# Patient Record
Sex: Female | Born: 1990 | Race: White | Hispanic: No | Marital: Single | Smoking: Never smoker
Health system: Southern US, Community
[De-identification: ages and names within clinical notes are randomized; demographics above are authoritative.]

## PROBLEM LIST (undated history)

## (undated) ENCOUNTER — Inpatient Hospital Stay (HOSPITAL_COMMUNITY): Payer: Self-pay

## (undated) ENCOUNTER — Telehealth

## (undated) ENCOUNTER — Encounter

## (undated) ENCOUNTER — Inpatient Hospital Stay

## (undated) DIAGNOSIS — Q5122 Other partial doubling of uterus: Secondary | ICD-10-CM

## (undated) DIAGNOSIS — R51 Headache: Secondary | ICD-10-CM

## (undated) DIAGNOSIS — Z8619 Personal history of other infectious and parasitic diseases: Secondary | ICD-10-CM

## (undated) DIAGNOSIS — N390 Urinary tract infection, site not specified: Secondary | ICD-10-CM

## (undated) DIAGNOSIS — O26613 Liver and biliary tract disorders in pregnancy, third trimester: Secondary | ICD-10-CM

## (undated) DIAGNOSIS — R519 Headache, unspecified: Secondary | ICD-10-CM

## (undated) DIAGNOSIS — K831 Obstruction of bile duct: Secondary | ICD-10-CM

## (undated) DIAGNOSIS — O039 Complete or unspecified spontaneous abortion without complication: Secondary | ICD-10-CM

## (undated) HISTORY — DX: Obstruction of bile duct: O26.613

## (undated) HISTORY — PX: NO PAST SURGERIES: SHX2092

## (undated) HISTORY — DX: Headache, unspecified: R51.9

## (undated) HISTORY — DX: Partial doubling of uterus: Q51.22

## (undated) HISTORY — DX: Complete or unspecified spontaneous abortion without complication: O03.9

## (undated) HISTORY — DX: Urinary tract infection, site not specified: N39.0

## (undated) HISTORY — DX: Headache: R51

## (undated) HISTORY — DX: Personal history of other infectious and parasitic diseases: Z86.19

## (undated) HISTORY — DX: Obstruction of bile duct: K83.1

## (undated) SURGERY — Surgical Case
Anesthesia: *Unknown

---

## 2014-04-15 ENCOUNTER — Other Ambulatory Visit: Payer: Self-pay | Admitting: Obstetrics & Gynecology

## 2014-04-15 ENCOUNTER — Ambulatory Visit (INDEPENDENT_AMBULATORY_CARE_PROVIDER_SITE_OTHER): Payer: BC Managed Care – PPO | Admitting: Adult Health

## 2014-04-15 ENCOUNTER — Encounter: Payer: Self-pay | Admitting: Adult Health

## 2014-04-15 ENCOUNTER — Telehealth: Payer: Self-pay | Admitting: Advanced Practice Midwife

## 2014-04-15 DIAGNOSIS — Z3201 Encounter for pregnancy test, result positive: Secondary | ICD-10-CM

## 2014-04-15 DIAGNOSIS — O3680X0 Pregnancy with inconclusive fetal viability, not applicable or unspecified: Secondary | ICD-10-CM

## 2014-04-15 LAB — POCT URINE PREGNANCY: Preg Test, Ur: POSITIVE

## 2014-04-15 NOTE — Telephone Encounter (Signed)
Pt states positive pregnancy test this am, now noticing spotting when she wipes only, mild cramping. Pt states was told at appt this am she was about 7  Weeks based on LMP. Pt informed to continue to monitor bleeding, if bleeding increases or severe cramping call our office back or go to [facility]. Pt verbalized understanding.

## 2014-04-17 ENCOUNTER — Emergency Department (HOSPITAL_COMMUNITY)
Admission: EM | Admit: 2014-04-17 | Discharge: 2014-04-17 | Disposition: A | Discharge status: Home / Self Care | Payer: BC Managed Care – PPO | Attending: Emergency Medicine | Admitting: Emergency Medicine

## 2014-04-17 ENCOUNTER — Encounter (HOSPITAL_COMMUNITY): Payer: Self-pay | Admitting: Emergency Medicine

## 2014-04-17 ENCOUNTER — Emergency Department (HOSPITAL_COMMUNITY): Payer: BC Managed Care – PPO

## 2014-04-17 DIAGNOSIS — O039 Complete or unspecified spontaneous abortion without complication: Secondary | ICD-10-CM | POA: Diagnosis present

## 2014-04-17 LAB — CBC WITH DIFFERENTIAL/PLATELET
BASOS ABS: 0 10*3/uL (ref 0.0–0.1)
Band Neutrophils: 0 % (ref 0–10)
Basophils Relative: 0 % (ref 0–1)
Blasts: 0 %
EOS ABS: 0.4 10*3/uL (ref 0.0–0.7)
EOS PCT: 2 % (ref 0–5)
HCT: 38.5 % (ref 36.0–46.0)
HEMOGLOBIN: 13.3 g/dL (ref 12.0–15.0)
Lymphocytes Relative: 21 % (ref 12–46)
Lymphs Abs: 3.9 10*3/uL (ref 0.7–4.0)
MCH: 30 pg (ref 26.0–34.0)
MCHC: 34.5 g/dL (ref 30.0–36.0)
MCV: 86.7 fL (ref 78.0–100.0)
METAMYELOCYTES PCT: 0 %
MONO ABS: 1.5 10*3/uL — AB (ref 0.1–1.0)
MONOS PCT: 8 % (ref 3–12)
Myelocytes: 0 %
NEUTROS ABS: 12.9 10*3/uL — AB (ref 1.7–7.7)
NRBC: 0 /100{WBCs}
Neutrophils Relative %: 69 % (ref 43–77)
Platelets: 512 10*3/uL — ABNORMAL HIGH (ref 150–400)
Promyelocytes Absolute: 0 %
RBC: 4.44 MIL/uL (ref 3.87–5.11)
RDW: 12.7 % (ref 11.5–15.5)
WBC: 18.7 10*3/uL — AB (ref 4.0–10.5)

## 2014-04-17 LAB — URINALYSIS, ROUTINE W REFLEX MICROSCOPIC
BILIRUBIN URINE: NEGATIVE
Glucose, UA: NEGATIVE mg/dL
KETONES UR: NEGATIVE mg/dL
Nitrite: NEGATIVE
PH: 5 (ref 5.0–8.0)
Specific Gravity, Urine: 1.025 (ref 1.005–1.030)
Urobilinogen, UA: 0.2 mg/dL (ref 0.0–1.0)

## 2014-04-17 LAB — BASIC METABOLIC PANEL
ANION GAP: 13 (ref 5–15)
BUN: 12 mg/dL (ref 6–23)
CHLORIDE: 100 meq/L (ref 96–112)
CO2: 24 mEq/L (ref 19–32)
CREATININE: 0.63 mg/dL (ref 0.50–1.10)
Calcium: 9.1 mg/dL (ref 8.4–10.5)
Glucose, Bld: 110 mg/dL — ABNORMAL HIGH (ref 70–99)
POTASSIUM: 3.2 meq/L — AB (ref 3.7–5.3)
Sodium: 137 mEq/L (ref 137–147)

## 2014-04-17 LAB — URINE MICROSCOPIC-ADD ON

## 2014-04-17 LAB — PREGNANCY, URINE: Preg Test, Ur: POSITIVE — AB

## 2014-04-17 LAB — WET PREP, GENITAL
Clue Cells Wet Prep HPF POC: NONE SEEN
Trich, Wet Prep: NONE SEEN
Yeast Wet Prep HPF POC: NONE SEEN

## 2014-04-17 LAB — HCG, QUANTITATIVE, PREGNANCY: hCG, Beta Chain, Quant, S: 7254 m[IU]/mL — ABNORMAL HIGH (ref <5)

## 2014-04-17 MED ORDER — MORPHINE SULFATE 4 MG/ML IJ SOLN: 2.0000 mg | Freq: Once | INTRAMUSCULAR | Status: DC

## 2014-04-17 NOTE — ED Provider Notes (Signed)
Patient's ultrasound showed a spontaneous abortion. No signs of ectopic pregnancy. She has a gynecologist and she will follow with on Monday  Toy Baker, MD 04/17/14 2230

## 2014-04-17 NOTE — ED Provider Notes (Signed)
CSN: [id number]     Arrival date & time 04/17/14  2041 History   First MD Initiated Contact with Patient 04/17/14 2043     Chief Complaint  Patient presents with  . Abdominal Pain     (Consider location/radiation/quality/duration/timing/severity/associated sxs/prior Treatment) HPI Comments: 23 year old female, G1 P0 at approximately 8 weeks by last menstrual period who has a pregnancy confirmed by urine pregnancy but no ultrasound. She presents with abdominal pain that started earlier today. She feels like this is a severe pain, it is intermittent, it comes on spontaneously and goes away spontaneously, does not get worse with urination, defecation, eating or palpation abdomen. She does have associated vaginal bleeding which she states appears to be large clots.  Patient is a 23 y.o. female presenting with abdominal pain. The history is provided by the patient.  Abdominal Pain   Past Medical History  Diagnosis Date  . Renal disorder    History reviewed. No pertinent past surgical history. No family history on file. History  Substance Use Topics  . Smoking status: Never Smoker   . Smokeless tobacco: Not on file  . Alcohol Use: No   OB History   Grav Para Term Preterm Abortions TAB SAB Ect Mult Living   1              Review of Systems  Gastrointestinal: Positive for abdominal pain.  All other systems reviewed and are negative.     Allergies  Review of patient's allergies indicates no known allergies.  Home Medications   Prior to Admission medications   Not on File   BP 130/67  Pulse 99  Temp(Src) 97.7 F (36.5 C) (Oral)  Resp 20  Ht  (1.727 m)  Wt 160 lb (72.576 kg)  BMI 24.33 kg/m2  SpO2 100% Physical Exam  Nursing note and vitals reviewed. Constitutional: She appears well-developed and well-nourished. No distress.  HENT:  Head: Normocephalic and atraumatic.  Mouth/Throat: Oropharynx is clear and moist. No oropharyngeal exudate.  Eyes: Conjunctivae  and EOM are normal. Pupils are equal, round, and reactive to light. Right eye exhibits no discharge. Left eye exhibits no discharge. No scleral icterus.  Neck: Normal range of motion. Neck supple. No JVD present. No thyromegaly present.  Cardiovascular: Normal rate, regular rhythm, normal heart sounds and intact distal pulses.  Exam reveals no gallop and no friction rub.   No murmur heard. Pulmonary/Chest: Effort normal and breath sounds normal. No respiratory distress. She has no wheezes. She has no rales.  Abdominal: Soft. Bowel sounds are normal. She exhibits no distension and no mass. There is no tenderness.  Genitourinary:  Chaperone present for exam, small amount of dark red blood present in the vaginal vault, cervix is slightly open, no fetal parts visualized, no tenderness on bimanual exam, no discharge  Musculoskeletal: Normal range of motion. She exhibits no edema and no tenderness.  Lymphadenopathy:    She has no cervical adenopathy.  Neurological: She is alert. Coordination normal.  Skin: Skin is warm and dry. No rash noted. No erythema.  Psychiatric: She has a normal mood and affect. Her behavior is normal.    ED Course  Procedures (including critical care time) Labs Review Labs Reviewed  URINALYSIS, ROUTINE W REFLEX MICROSCOPIC  HCG, QUANTITATIVE, PREGNANCY  CBC WITH DIFFERENTIAL  BASIC METABOLIC PANEL  PREGNANCY, URINE    Imaging Review No results found.    MDM   Final diagnoses:  None    The patient appears very uncomfortable, the pain  seems to come in waves, on exam she has no tenderness to palpation when the pain is not present, but that ultrasound does not reveal a definite intrauterine pregnancy.  Ectopic pregnancy will be ruled out with pelvic ultrasound, technician called in to do this after hours, patient informed if negative can be discharged home with threatened abortion, if positive will need gynecologic evaluation  Change of shift - care signed out  to Dr Freida Busman.  Vida Roller, MD 04/18/14 [phone]

## 2014-04-17 NOTE — ED Notes (Signed)
Pt reports abdominal pain that started today. Pt states feels like something is rupturing inside of her. Pt states she is [redacted] weeks pregnant

## 2014-04-17 NOTE — Discharge Instructions (Signed)
Miscarriage A miscarriage is the sudden loss of an unborn baby (fetus) before the 20th week of pregnancy. Most miscarriages happen in the first 3 months of pregnancy. Sometimes, it happens before a woman even knows she is pregnant. A miscarriage is also called a "spontaneous miscarriage" or "early pregnancy loss." Having a miscarriage can be an emotional experience. Talk with your caregiver about any questions you may have about miscarrying, the grieving process, and your future pregnancy plans. CAUSES   Problems with the fetal chromosomes that make it impossible for the baby to develop normally. Problems with the baby's genes or chromosomes are most often the result of errors that occur, by chance, as the embryo divides and grows. The problems are not inherited from the parents.  Infection of the cervix or uterus.   Hormone problems.   Problems with the cervix, such as having an incompetent cervix. This is when the tissue in the cervix is not strong enough to hold the pregnancy.   Problems with the uterus, such as an abnormally shaped uterus, uterine fibroids, or congenital abnormalities.   Certain medical conditions.   Smoking, drinking alcohol, or taking illegal drugs.   Trauma.  Often, the cause of a miscarriage is unknown.  SYMPTOMS   Vaginal bleeding or spotting, with or without cramps or pain.  Pain or cramping in the abdomen or lower back.  Passing fluid, tissue, or blood clots from the vagina. DIAGNOSIS  Your caregiver will perform a physical exam. You may also have an ultrasound to confirm the miscarriage. Blood or urine tests may also be ordered. TREATMENT   Sometimes, treatment is not necessary if you naturally pass all the fetal tissue that was in the uterus. If some of the fetus or placenta remains in the body (incomplete miscarriage), tissue left behind may become infected and must be removed. Usually, a dilation and curettage (D and C) procedure is performed.  During a D and C procedure, the cervix is widened (dilated) and any remaining fetal or placental tissue is gently removed from the uterus.  Antibiotic medicines are prescribed if there is an infection. Other medicines may be given to reduce the size of the uterus (contract) if there is a lot of bleeding.  If you have Rh negative blood and your baby was Rh positive, you will need a Rh immunoglobulin shot. This shot will protect any future baby from having Rh blood problems in future pregnancies. HOME CARE INSTRUCTIONS   Your caregiver may order bed rest or may allow you to continue light activity. Resume activity as directed by your caregiver.  Have someone help with home and family responsibilities during this time.   Keep track of the number of sanitary pads you use each day and how soaked (saturated) they are. Write down this information.   Do not use tampons. Do not douche or have sexual intercourse until approved by your caregiver.   Only take over-the-counter or prescription medicines for pain or discomfort as directed by your caregiver.   Do not take aspirin. Aspirin can cause bleeding.   Keep all follow-up appointments with your caregiver.   If you or your partner have problems with grieving, talk to your caregiver or seek counseling to help cope with the pregnancy loss. Allow enough time to grieve before trying to get pregnant again.  SEEK IMMEDIATE MEDICAL CARE IF:   You have severe cramps or pain in your back or abdomen.  You have a fever.  You pass large blood clots (walnut-sized   or larger) ortissue from your vagina. Save any tissue for your caregiver to inspect.   Your bleeding increases.   You have a thick, bad-smelling vaginal discharge.  You become lightheaded, weak, or you faint.   You have chills.  MAKE SURE YOU:  Understand these instructions.  Will watch your condition.  Will get help right away if you are not doing well or get  worse. Document Released: 01/09/2001 Document Revised: 11/10/2012 Document Reviewed: 09/04/2011 Exit[employer] Patient Information 2015 [employer]. This information is not intended to replace advice given to you by your health care provider. Make sure you discuss any questions you have with your health care provider.  

## 2014-04-17 NOTE — ED Notes (Signed)
When obtaining urine sample, patient states she expressed a large clot vaginally.  Reports no further abdominal cramping or nausea.  Dr. Hyacinth Meeker was made aware and pelvic exam was done by him.

## 2014-04-19 ENCOUNTER — Encounter: Payer: Self-pay | Admitting: Adult Health

## 2014-04-20 ENCOUNTER — Other Ambulatory Visit: Payer: Self-pay | Admitting: Obstetrics & Gynecology

## 2014-04-20 ENCOUNTER — Encounter: Payer: Self-pay | Admitting: Advanced Practice Midwife

## 2014-04-20 ENCOUNTER — Ambulatory Visit (INDEPENDENT_AMBULATORY_CARE_PROVIDER_SITE_OTHER): Payer: BC Managed Care – PPO | Admitting: Advanced Practice Midwife

## 2014-04-20 ENCOUNTER — Ambulatory Visit (INDEPENDENT_AMBULATORY_CARE_PROVIDER_SITE_OTHER): Payer: BC Managed Care – PPO

## 2014-04-20 VITALS — BP 120/78 | Ht 69.0 in | Wt 182.0 lb

## 2014-04-20 DIAGNOSIS — O3680X0 Pregnancy with inconclusive fetal viability, not applicable or unspecified: Secondary | ICD-10-CM

## 2014-04-20 DIAGNOSIS — O039 Complete or unspecified spontaneous abortion without complication: Secondary | ICD-10-CM

## 2014-04-20 DIAGNOSIS — R300 Dysuria: Secondary | ICD-10-CM

## 2014-04-20 HISTORY — DX: Complete or unspecified spontaneous abortion without complication: O03.9

## 2014-04-20 LAB — CBC
HEMATOCRIT: 38.4 % (ref 36.0–46.0)
HEMOGLOBIN: 13.3 g/dL (ref 12.0–15.0)
MCH: 29.4 pg (ref 26.0–34.0)
MCHC: 34.6 g/dL (ref 30.0–36.0)
MCV: 84.8 fL (ref 78.0–100.0)
Platelets: 451 10*3/uL — ABNORMAL HIGH (ref 150–400)
RBC: 4.53 MIL/uL (ref 3.87–5.11)
RDW: 13.5 % (ref 11.5–15.5)
WBC: 12.8 10*3/uL — ABNORMAL HIGH (ref 4.0–10.5)

## 2014-04-20 LAB — GC/CHLAMYDIA PROBE AMP
CT Probe RNA: NEGATIVE
GC Probe RNA: NEGATIVE

## 2014-04-20 NOTE — Progress Notes (Signed)
[facility] Visit  Patient name: Joann Marshall MRN [id number]  Date of birth: Jun 18, 1991  CC & HPI:  Joann Marshall is a 23 y.o. Caucasian female presenting today for F/U SAB.  Seen in ED 3 days ago for bleeding:  US showed SAB in progress.  Pt reports having passed "a large clot and tissue".  Still bleeding, wearing panty liners.  + cramps  Pertinent History Reviewed:  Medical & Surgical Hx:   Past Medical History  Diagnosis Date  . UTI (urinary tract infection)   . Renal disorder    History reviewed. No pertinent past surgical history. Medications: Reviewed & Updated - see associated section Social History: Reviewed -  reports that she has never smoked. She does not have any smokeless tobacco history on file.  Objective Findings:  Vitals: BP 120/78  Ht  (1.753 m)  Wt 182 lb (82.555 kg)  BMI 26.86 kg/m2  Physical Examination:  GESTATION:  No intrauterine GS noted,  Endometrium=8.7mm  CERVIX:  Appears closed  ADNEXA:  The ovaries are normal.  Bilateral adnexa/ovaries appear WNL no free fluid or adnexal masses noted within the pelvis  TECHNICIAN COMMENTS:  SAB, ENdometrium=8.12mm, anteverted uterus, cervix appears closed, bilateral adnexa/ovaries appears WNL no free fluid or adnexal masses noted within the pelvis      No results found for this or any previous visit (from the past 24 hour(s)).   Assessment & Plan:  A:   Complete SAB P:  QHCG  ABO  CBC   Bleeding precautions given.  Continue PNV (wants to get pregnant again in 2 months-plans condoms in the meantime).  If Rh neg will call for rhogam  F/U prn   CRESENZO-DISHMAN,Kameko Hukill CNM 04/20/2014 11:50 AM

## 2014-04-21 ENCOUNTER — Encounter: Payer: Self-pay | Admitting: Adult Health

## 2014-04-21 ENCOUNTER — Telehealth: Payer: Self-pay | Admitting: *Deleted

## 2014-04-21 ENCOUNTER — Ambulatory Visit (INDEPENDENT_AMBULATORY_CARE_PROVIDER_SITE_OTHER): Payer: BC Managed Care – PPO | Admitting: Adult Health

## 2014-04-21 VITALS — BP 112/82 | Ht 69.0 in | Wt 181.5 lb

## 2014-04-21 DIAGNOSIS — Z130 Encounter for screening for diseases of the blood and blood-forming organs and certain disorders involving the immune mechanism: Secondary | ICD-10-CM

## 2014-04-21 DIAGNOSIS — Z6791 Unspecified blood type, Rh negative: Secondary | ICD-10-CM

## 2014-04-21 DIAGNOSIS — O039 Complete or unspecified spontaneous abortion without complication: Secondary | ICD-10-CM

## 2014-04-21 LAB — ABO AND RH: Rh Type: NEGATIVE

## 2014-04-21 LAB — HCG, QUANTITATIVE, PREGNANCY: HCG, BETA CHAIN, QUANT, S: 1467.3 m[IU]/mL

## 2014-04-21 LAB — POCT HEMOGLOBIN: HEMOGLOBIN: 13.1 g/dL (ref 12.2–16.2)

## 2014-04-21 LAB — URINE CULTURE
COLONY COUNT: NO GROWTH
Organism ID, Bacteria: NO GROWTH

## 2014-04-21 MED ORDER — RHO D IMMUNE GLOBULIN 1500 UNIT/2ML IJ SOSY
300.0000 ug | PREFILLED_SYRINGE | Freq: Once | INTRAMUSCULAR | Status: AC
  Administered 2014-04-21: 300 ug via INTRAMUSCULAR

## 2014-04-21 NOTE — Telephone Encounter (Signed)
Pt was here today for a Rhogam injection after miscarriage and was wanting to know if she would ever need this medication again.  Explained to her that she would not need another injection for this incidence but if she were to get pregnant again she would get it during the third trimester of pregnancy.  Pt verbalized understanding.

## 2014-05-04 ENCOUNTER — Ambulatory Visit: Payer: BC Managed Care – PPO | Admitting: Family Medicine

## 2014-06-01 ENCOUNTER — Encounter: Payer: Self-pay | Admitting: Adult Health

## 2014-07-30 NOTE — L&D Delivery Note (Addendum)
Final Labor Progress Note At 1725 pt reports an increased in rectal pressure.  FHR remained reassuring.  Vaginal Delivery Note The pt utilized an epidural as pain management.   Artifical rupture of membranes today, at 1513, clear.  GBS was negative  Cervical dilation was complete at  1730.  NICHD Category 1.    Pushing with guidance began at  1730.   After 25 minutes of pushing the head, shoulders and the body of a viable female infant "Kyung RuddKennedy" delivered spontaneously with maternal effort in the LOT position at 1755  With vigorous tone and spontaneous cry, the infant was placed on moms abd.  After the umbilical cord was clamped it was cut by the FOB, then cord blood was obtained for evaluation. Spontaneous delivery of a intact placenta with a 3 vessel cord via Shultz at 1806.   Episiotomy: None   The vulva, perineum, vaginal vault, rectum and cervix were inspected and revealed a 2 degree perineal, repaired using a 3-0 vicryl on a CT needle and a superficial bilateral periurethral laceration which was repaired using a 4-0 vicryl on a SH needle with 30cc of 1% lidocaine. Patient tolerated repair well.   Postpartum pitocin as ordered.  Fundus firm, lochia minimum, bleeding under control. EBL 200,  QBL pending  Pt hemodynamically stable.   Sponge, laps and needle count correct and verified with the primary care nurse.  Attending MD available at all times.    Routine postpartum orders   Mother unsure about method of contraception Mom plans to breastfeed   Placenta to pathology: NO     Cord Gases sent to lab: NO Cord blood sent to lab: YES   APGARS:  8 at 1 minute and 9 at 5 minutes Weight:. pending     Both mom and baby were left in stable condition, baby skin to skin.      Chella Chapdelaine, CNM, MSN 07/25/2015. 6:50 PM

## 2014-09-09 ENCOUNTER — Ambulatory Visit (HOSPITAL_COMMUNITY)
Admission: RE | Admit: 2014-09-09 | Discharge: 2014-09-09 | Disposition: A | Discharge status: Home / Self Care | Payer: BLUE CROSS/BLUE SHIELD | Source: Ambulatory Visit | Attending: Physician Assistant | Admitting: Physician Assistant

## 2014-09-09 ENCOUNTER — Other Ambulatory Visit (HOSPITAL_COMMUNITY): Payer: Self-pay | Admitting: Physician Assistant

## 2014-09-09 DIAGNOSIS — M545 Low back pain: Secondary | ICD-10-CM | POA: Diagnosis present

## 2014-09-20 ENCOUNTER — Encounter: Payer: Self-pay | Admitting: Gastroenterology

## 2014-10-12 ENCOUNTER — Ambulatory Visit: Payer: BLUE CROSS/BLUE SHIELD | Admitting: Gastroenterology

## 2014-10-12 ENCOUNTER — Encounter: Payer: Self-pay | Admitting: Gastroenterology

## 2014-10-12 ENCOUNTER — Telehealth: Payer: Self-pay | Admitting: Gastroenterology

## 2014-10-12 NOTE — Telephone Encounter (Signed)
PATIENT WAS A NO SHOW 10/12/14 AND LETTER WAS SENT

## 2014-12-10 LAB — OB RESULTS CONSOLE RPR: RPR: NONREACTIVE

## 2014-12-10 LAB — OB RESULTS CONSOLE HIV ANTIBODY (ROUTINE TESTING): HIV: NONREACTIVE

## 2014-12-10 LAB — OB RESULTS CONSOLE GC/CHLAMYDIA
Chlamydia: NEGATIVE
Gonorrhea: NEGATIVE

## 2014-12-10 LAB — OB RESULTS CONSOLE ABO/RH: RH TYPE: NEGATIVE

## 2014-12-10 LAB — OB RESULTS CONSOLE RUBELLA ANTIBODY, IGM: Rubella: IMMUNE

## 2014-12-10 LAB — OB RESULTS CONSOLE ANTIBODY SCREEN: ANTIBODY SCREEN: NEGATIVE

## 2014-12-10 LAB — OB RESULTS CONSOLE HEPATITIS B SURFACE ANTIGEN: HEP B S AG: NEGATIVE

## 2015-04-26 ENCOUNTER — Other Ambulatory Visit (HOSPITAL_COMMUNITY): Payer: Self-pay | Admitting: Physician Assistant

## 2015-04-26 DIAGNOSIS — O283 Abnormal ultrasonic finding on antenatal screening of mother: Secondary | ICD-10-CM

## 2015-04-26 DIAGNOSIS — Z3689 Encounter for other specified antenatal screening: Secondary | ICD-10-CM

## 2015-05-03 ENCOUNTER — Encounter (HOSPITAL_COMMUNITY): Payer: Self-pay

## 2015-05-03 ENCOUNTER — Ambulatory Visit (HOSPITAL_COMMUNITY)
Admission: RE | Admit: 2015-05-03 | Discharge: 2015-05-03 | Disposition: A | Discharge status: Home / Self Care | Payer: BLUE CROSS/BLUE SHIELD | Source: Ambulatory Visit | Attending: Obstetrics and Gynecology | Admitting: Obstetrics and Gynecology

## 2015-05-03 ENCOUNTER — Other Ambulatory Visit (HOSPITAL_COMMUNITY): Payer: Self-pay | Admitting: *Deleted

## 2015-05-03 ENCOUNTER — Other Ambulatory Visit (HOSPITAL_COMMUNITY): Payer: Self-pay | Admitting: Physician Assistant

## 2015-05-03 ENCOUNTER — Encounter (HOSPITAL_COMMUNITY): Payer: Self-pay | Admitting: Obstetrics and Gynecology

## 2015-05-03 DIAGNOSIS — K831 Obstruction of bile duct: Secondary | ICD-10-CM

## 2015-05-03 DIAGNOSIS — Z3A25 25 weeks gestation of pregnancy: Secondary | ICD-10-CM

## 2015-05-03 DIAGNOSIS — O368390 Maternal care for abnormalities of the fetal heart rate or rhythm, unspecified trimester, not applicable or unspecified: Secondary | ICD-10-CM

## 2015-05-03 DIAGNOSIS — O360190 Maternal care for anti-D [Rh] antibodies, unspecified trimester, not applicable or unspecified: Secondary | ICD-10-CM

## 2015-05-03 DIAGNOSIS — O26613 Liver and biliary tract disorders in pregnancy, third trimester: Secondary | ICD-10-CM | POA: Insufficient documentation

## 2015-05-03 DIAGNOSIS — Z3689 Encounter for other specified antenatal screening: Secondary | ICD-10-CM

## 2015-05-03 DIAGNOSIS — O283 Abnormal ultrasonic finding on antenatal screening of mother: Secondary | ICD-10-CM

## 2015-05-03 DIAGNOSIS — O34599 Maternal care for other abnormalities of gravid uterus, unspecified trimester: Secondary | ICD-10-CM

## 2015-05-03 DIAGNOSIS — O26619 Liver and biliary tract disorders in pregnancy, unspecified trimester: Principal | ICD-10-CM

## 2015-05-03 DIAGNOSIS — Z3A32 32 weeks gestation of pregnancy: Secondary | ICD-10-CM | POA: Insufficient documentation

## 2015-05-03 DIAGNOSIS — O26612 Liver and biliary tract disorders in pregnancy, second trimester: Principal | ICD-10-CM

## 2015-05-03 NOTE — Progress Notes (Signed)
MFM Consultation,   Cholestasis of Pregnancy:  Intrahepatic cholestasis of pregnancy (ICP) affects 0.7% of white pregnant women, approximately twice as many [place] Asian women, and up to 5% of [place] women. The recurrence rate of ICP varies from 60% to 90% in different populations.  Fetal complications that occur more commonly in ICP pregnancies include preterm labor, fetal asphyxial events, meconium staining of amniotic fluid, and intrauterine death. Three studies have demonstrated that ICP patients with higher maternal serum bile acid levels (>40 ?mol/L in two studies) more commonly have pregnancies complicated by meconium-stained liquor and fetal asphyxial events, and the largest study also demonstrated that patients with higher levels of bile acids had higher rates of spontaneous preterm labor. Note her bile acids were 3-fold above the upper limit of normal in pregnancy (>10 = cholestasis).  ICP should be also in pregnant women with pruritus but without a rash. The pruritus is commonly generalized or affects the palms and soles, but it can occur on any part of the body. There is no consensus about the most reliable biochemical test for diagnosing ICP. Periotic measuring levels of liver transaminases with serum bile acids is recommended. Although ICP often identified during late pregnancy, early recurrent has also been reported. So serum bile acid test is recommended and Ms. Joann Marshall wants go back to her primary OB office to have the test done.  Cholestasis may also occur in conjunction with other liver diseases. It is advisable to perform a liver ultrasound scan to exclude biliary obstruction. Affected women commonly have gallstones, however, the gallstones are unlikely to be the cause of the cholestasis unless the woman has symptoms of biliary obstruction. Other conditions that can be associated with ICP are hepatitis C, autoimmune hepatitis, and primary biliary cirrhosis. These conditions have  important implications for the subsequent health of the mother, and it is therefore advisable to screen for them.  Ursodeoxycholic acid (UDCA) is the only drug that has consistently been shown to improve the maternal symptoms and biochemical features of ICP. There have been several reports about the efficacy of UDCA in ICP, but there have been few randomized, controlled trials. The  largest trial showed that UDCA reduced levels of pruritus, liver transaminases, and bilirubin compared with dexamethasone or placebo and that it was particularly effective in women with serum levels of bile acids higher than 40 ?mol/L. UDCA is usually started at a dose of 500 mg twice daily, and the dose may be increased further.  A variety of other drugs have been proposed as treatments for ICP, including dexamethasone, S-adenosyl methionine, cholestyramine, and guar gum, but there is less evidence for their efficacy than there is for UDCA. It is advisable to give affected women vitamin K because of the theoretical risk of hemorrhage in association with ICP.  No treatments have been shown to reduce fetal risks associated with ICP. However, it is likely that treatments that reduce levels of maternal bile acids also reduce fetal risk because of the data that implicate bile acids in pregnancies complicated by spontaneous preterm delivery, fetal asphyxial events, and meconium-stained amniotic fluid. None of the UDCA trials has been powered to investigate whether the drug protects the fetus. However, it is known that UDCA treatment improves the serum bile acid levels measured in cord blood and amniotic fluid at the time of delivery.   The only forms of fetal surveillance that have been shown to predict which fetuses may be at risk are amniocentesis and amnioscopy for meconium. However, such an approach  is likely to be considered too intrusive to be used routinely by most obstetricians. Many obstetric units review women with ICP  several times per week for fetal assessment by electronic fetal monitoring and/or biophysical profile, or both.  No heart defect or arrhythmia was detected on our ultrasound today but suspicion in your office may have been triggered by [facility] or PVC which is common and benign.  Surveillance Recommendations: 1. twice weekly NSTs at 32 weeks 2. weekly AFI and fetal activity (tone/movement/fluid) until 30 weeks (then begin weekly BPP) 3. interval growth monthly  Your patient asked to have the aforementioned appointments in our unit, and these were made in our unit at her request.  Management reccommendations in brief: 1. ursodiol  po bid for cholestasis 2. recommend delivery by 37 weeks. 3. Auscultation of fetal heart tones by Doppler in your office during routine prenatal visits in the interim along with formal fetal kick counts 2-3x/day (<10 kicks/2 hours to prompt presentation for evaluation) beginning in the late trimester.  Time Spent:  I spent in excess of 30 minutes in consultation with this patient to review records, evaluate her case, and provide her with an adequate discussion and education.   More than 50% of this time was spent in direct face-to-face counseling. It was a pleasure seeing your patient in the office today. Thank you for consultation. Please do not hesitate to contact our service for any further questions.   Thank you,  Louann Sjogren Gaynelle Arabian, Louann Sjogren, MD, MS, FACOG Assistant Professor Section of Maternal-Fetal Medicine [facility]

## 2015-05-17 ENCOUNTER — Ambulatory Visit (HOSPITAL_COMMUNITY): Payer: BLUE CROSS/BLUE SHIELD

## 2015-05-24 ENCOUNTER — Ambulatory Visit (HOSPITAL_COMMUNITY): Payer: BLUE CROSS/BLUE SHIELD

## 2015-05-31 ENCOUNTER — Ambulatory Visit (HOSPITAL_COMMUNITY): Payer: BLUE CROSS/BLUE SHIELD

## 2015-06-07 ENCOUNTER — Ambulatory Visit (HOSPITAL_COMMUNITY): Payer: BLUE CROSS/BLUE SHIELD

## 2015-07-14 ENCOUNTER — Other Ambulatory Visit: Payer: Self-pay | Admitting: Obstetrics and Gynecology

## 2015-07-18 ENCOUNTER — Telehealth (HOSPITAL_COMMUNITY): Payer: Self-pay | Admitting: *Deleted

## 2015-07-18 ENCOUNTER — Encounter (HOSPITAL_COMMUNITY): Payer: Self-pay | Admitting: *Deleted

## 2015-07-18 NOTE — Telephone Encounter (Signed)
Preadmission screen  

## 2015-07-18 NOTE — Telephone Encounter (Signed)
Preadmission screenPreadmission screen 

## 2015-07-20 LAB — OB RESULTS CONSOLE GBS: STREP GROUP B AG: NEGATIVE

## 2015-07-24 NOTE — H&P (Signed)
Joann Marshall is a 24 y.o. female, G2P0 at 36.6 weeks, presenting for IOL secondary to cholestasis of pregnancy.  Pregnancy history also significant for partial separate uterus, rH negative, and rubella equivocal. Patient reports taking ursodiol 12/25 morning.  Patient is GBS negative and desires an epidural for pain mgmt.   Patient Active Problem List   Diagnosis Date Noted  . Spontaneous abortion in first trimester 04/20/2014    History of present pregnancy: Patient entered care at 6.0 weeks.   EDC of 08/15/2015 was established by 6.0wk US on 12/20/2014.   Anatomy scan:  20 weeks, with normal findings and an anterior placenta.   Additional US evaluations:  -Anatomy US: SIUP, NORMAL FLUID, GROWTH 48%, CX 3.22 CM, NOT ALL CARDIAC ANATOMY WAS SEEN, FETAL ARRYTHMIA NOTED. -27.1wks: US: BPP 8/8, AFI 19.42 cm 75% cx3.29 cm closed, anterior placenta. No previa seen. fetus is in cephalic position.  -28.1wks: singleton pregnancy, vertex. anterior placenta. AFI normal Good movement and breathing noted -29.1wks: Singleton pregnancy. Vertex presentation. Anterior placenta. Placental edge is 5.8 from internal os, normal. Cervix appears closed. Fluid appears normal. AFI: 20.1cm, 85%  -30.1wks: US EFW 3-6 (41%) AFI WNL SIUP VTX -31.1wks: BPP 8/8, vtx, nl fluid -32.1wks: US: SIUP, VERTEX, BPP 8/8, NORMAL FLUID. -33.1wks: EFW today 4lbs 10oz, 56%, BPD lagging, nl fluid, ant placenta. -33.4wks: BPP 8/8 -34.2wks: US: SIUP, VERTEX, NORMAL FLUID, CX 3.83 CM, BPP 8/8. -35wks: BPP 8/8 -36.2wks: BPP 8/8, EFW 6+1, 53%ILE. BPD 12%ILE, HC 6%ILE, AFI 12.32, 40%ILE. CERVIX POSTERIOR, FT, 50%, VTX, -3.  Significant prenatal events: 1st Trimester: Patient c/o nausea and headaches.  Patient reports blacking out and having episodes of dizziness/lightheadness.  2nd Trimester: Patient c/o sinus pressure and epistaxis.  Also reports generalized itching.  3rd Trimester:   Patient c/o vaginal pressure Last evaluation:   07/20/2015 by V. Emilee HeroLatham, CNM in office.  FHR 150, VE 0/50/-3, BP 108/62, Wt: 186lbs, TWG: 16.25lbs  OB History    Gravida Para Term Preterm AB TAB SAB Ectopic Multiple Living   2    1  1         Past Medical History  Diagnosis Date  . UTI (urinary tract infection)   . Renal disorder   . 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 Partial septate uterus   . Headache   . Hx of varicella    Past Surgical History  Procedure Laterality Date  . No past surgeries     Family History: family history includes Other in her paternal grandfather. Social History:  reports that she has never smoked. She has never used smokeless tobacco. She reports that she does not drink alcohol or use illicit drugs.  Patient is single, but partner Laverda SorensonJosh Camisa is present and involved.  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   Prenatal Transfer Tool  Maternal Diabetes: No Genetic Screening: Normal Maternal Ultrasounds/Referrals: Abnormal:  Findings:   Other:  Fetal Dysrhythmia Fetal Ultrasounds or other Referrals:  Referred to Materal Fetal Medicine  Maternal Substance Abuse:  No Significant Maternal Medications:  Meds include: Other: Ursodiol, Hydroxyzine HCL Significant Maternal Lab Results: Lab values include: Group B Strep negative, Rh negative    ROS:  +FM, -LOF, -Vb, -Ctx No N/V, Constipation, Diarrhea, or problems with Urination No recent illness No HA, visual disturbances, SOB, or epigastric pain No skin rashes  Allergies  Allergen Reactions  . Amoxicillin        Last menstrual period 08/26/2014.  Physical Exam  Constitutional: She is oriented to person, place, and time. She appears well-developed and well-nourished. No distress.  HENT:  Head: Normocephalic and atraumatic.  Eyes: Pupils are equal, round, and reactive to light.  Neck: Normal range of motion.  Cardiovascular: Normal rate, regular rhythm and normal heart sounds.   Respiratory: Effort normal  and breath sounds normal.  GI: Soft.  Genitourinary: Vagina normal. No vaginal discharge found.  Musculoskeletal: Normal range of motion. She exhibits no edema.  Neurological: She is alert and oriented to person, place, and time.  Skin: Skin is warm and dry. No rash noted.    [place]: EFW: 6lbs 1oz by 36wk [place] Presentation: Vertex  FHR: 120 bpm, Mod Var, -Decels, +Accels UCs:  None graphed or palpated  Prenatal labs: ABO, Rh: O/Negative/-- (05/13 0000) Antibody: Negative (05/13 0000) Rubella:  Equivocal RPR: Nonreactive (05/13 0000)  HBsAg: Negative (05/13 0000)  HIV: Non-reactive (05/13 0000)  GBS:  Negative Sickle cell/Hgb electrophoresis:  N/A Pap:  Abnormal-LGSIL 07/2014 GC:  Negative Chlamydia:  Negative Other:  TSH-Normal, Bile Acids 9    Assessment IUP at 37wks Cat I FT Cholestasis GBS Negative Amoxicillin Allergy Bishop Score: 4  Plan: Admit to [facility] [facility]  Routine Labor and Delivery Orders per [facility] Protocol In room to complete assessment and discuss POC: -Discussed r/b of induction including fetal distress, serial induction, pain, and increased risk of c/s delivery -Discussed induction methods including cervical ripening agents, foley bulbs, and pitocin -Patient verbalizes understanding and wishes to proceed with induction process Induction/Augmentation Orders per [facility] Protocol Foley bulb placed without difficulty, patient tolerated well Encouraged to sleep-ambien ordered and available Dr.T. Cole to be updated as appropriate   Joellyn Quails, MSN 07/24/2015, 11:40 PM

## 2015-07-25 ENCOUNTER — Encounter (HOSPITAL_COMMUNITY): Payer: Self-pay

## 2015-07-25 ENCOUNTER — Inpatient Hospital Stay (HOSPITAL_COMMUNITY)
Admission: RE | Admit: 2015-07-25 | Discharge: 2015-07-27 | DRG: 775 | Disposition: A | Discharge status: Home / Self Care | Payer: BLUE CROSS/BLUE SHIELD | Source: Ambulatory Visit | Attending: Obstetrics and Gynecology | Admitting: Obstetrics and Gynecology

## 2015-07-25 ENCOUNTER — Inpatient Hospital Stay (HOSPITAL_COMMUNITY): Payer: BLUE CROSS/BLUE SHIELD | Admitting: Anesthesiology

## 2015-07-25 DIAGNOSIS — Z3A37 37 weeks gestation of pregnancy: Secondary | ICD-10-CM | POA: Diagnosis not present

## 2015-07-25 DIAGNOSIS — Z880 Allergy status to penicillin: Secondary | ICD-10-CM

## 2015-07-25 DIAGNOSIS — Z3483 Encounter for supervision of other normal pregnancy, third trimester: Secondary | ICD-10-CM | POA: Diagnosis present

## 2015-07-25 DIAGNOSIS — O26619 Liver and biliary tract disorders in pregnancy, unspecified trimester: Secondary | ICD-10-CM

## 2015-07-25 DIAGNOSIS — O26649 Intrahepatic cholestasis of pregnancy, unspecified trimester: Secondary | ICD-10-CM | POA: Diagnosis present

## 2015-07-25 DIAGNOSIS — O2662 Liver and biliary tract disorders in childbirth: Secondary | ICD-10-CM | POA: Diagnosis present

## 2015-07-25 DIAGNOSIS — K831 Obstruction of bile duct: Secondary | ICD-10-CM | POA: Diagnosis present

## 2015-07-25 LAB — CBC
HEMATOCRIT: 29.2 % — AB (ref 36.0–46.0)
Hemoglobin: 9.7 g/dL — ABNORMAL LOW (ref 12.0–15.0)
MCH: 28.4 pg (ref 26.0–34.0)
MCHC: 33.2 g/dL (ref 30.0–36.0)
MCV: 85.4 fL (ref 78.0–100.0)
Platelets: 275 10*3/uL (ref 150–400)
RBC: 3.42 MIL/uL — AB (ref 3.87–5.11)
RDW: 13.2 % (ref 11.5–15.5)
WBC: 14.5 10*3/uL — AB (ref 4.0–10.5)

## 2015-07-25 LAB — RPR: RPR Ser Ql: NONREACTIVE

## 2015-07-25 MED ORDER — CITRIC ACID-SODIUM CITRATE 334-500 MG/5ML PO SOLN: 30.0000 mL | ORAL | Status: DC | PRN

## 2015-07-25 MED ORDER — ACETAMINOPHEN 325 MG PO TABS: 650.0000 mg | ORAL_TABLET | ORAL | Status: DC | PRN

## 2015-07-25 MED ORDER — FLEET ENEMA 7-19 GM/118ML RE ENEM: 1.0000 | ENEMA | RECTAL | Status: DC | PRN

## 2015-07-25 MED ORDER — FENTANYL CITRATE (PF) 100 MCG/2ML IJ SOLN: 50.0000 ug | INTRAMUSCULAR | Status: DC | PRN

## 2015-07-25 MED ORDER — LACTATED RINGERS IV SOLN
500.0000 mL | INTRAVENOUS | Status: DC | PRN
  Administered 2015-07-25: 500 mL via INTRAVENOUS

## 2015-07-25 MED ORDER — FENTANYL CITRATE (PF) 100 MCG/2ML IJ SOLN: 100.0000 ug | INTRAMUSCULAR | Status: DC | PRN

## 2015-07-25 MED ORDER — LIDOCAINE HCL (PF) 1 % IJ SOLN
30.0000 mL | INTRAMUSCULAR | Status: AC | PRN
  Administered 2015-07-25: 30 mL via SUBCUTANEOUS
  Filled 2015-07-25: qty 30

## 2015-07-25 MED ORDER — TERBUTALINE SULFATE 1 MG/ML IJ SOLN
0.2500 mg | Freq: Once | INTRAMUSCULAR | Status: DC | PRN
  Filled 2015-07-25: qty 1

## 2015-07-25 MED ORDER — ONDANSETRON HCL 4 MG/2ML IJ SOLN: 4.0000 mg | INTRAMUSCULAR | Status: DC | PRN

## 2015-07-25 MED ORDER — PHENYLEPHRINE 40 MCG/ML (10ML) SYRINGE FOR IV PUSH (FOR BLOOD PRESSURE SUPPORT)
PREFILLED_SYRINGE | INTRAVENOUS | Status: AC
  Filled 2015-07-25: qty 20

## 2015-07-25 MED ORDER — SIMETHICONE 80 MG PO CHEW: 80.0000 mg | CHEWABLE_TABLET | ORAL | Status: DC | PRN

## 2015-07-25 MED ORDER — ZOLPIDEM TARTRATE 5 MG PO TABS
5.0000 mg | ORAL_TABLET | Freq: Every evening | ORAL | Status: DC | PRN
  Administered 2015-07-25: 5 mg via ORAL
  Filled 2015-07-25: qty 1

## 2015-07-25 MED ORDER — PHENYLEPHRINE 40 MCG/ML (10ML) SYRINGE FOR IV PUSH (FOR BLOOD PRESSURE SUPPORT)
80.0000 ug | PREFILLED_SYRINGE | INTRAVENOUS | Status: DC | PRN
  Filled 2015-07-25: qty 2

## 2015-07-25 MED ORDER — LIDOCAINE HCL (PF) 1 % IJ SOLN
INTRAMUSCULAR | Status: DC | PRN
  Administered 2015-07-25 (×2): 8 mL via EPIDURAL

## 2015-07-25 MED ORDER — OXYCODONE-ACETAMINOPHEN 5-325 MG PO TABS: 2.0000 | ORAL_TABLET | ORAL | Status: DC | PRN

## 2015-07-25 MED ORDER — LANOLIN HYDROUS EX OINT: TOPICAL_OINTMENT | CUTANEOUS | Status: DC | PRN

## 2015-07-25 MED ORDER — FERROUS SULFATE 325 (65 FE) MG PO TABS
325.0000 mg | ORAL_TABLET | Freq: Two times a day (BID) | ORAL | Status: DC
  Administered 2015-07-26 – 2015-07-27 (×3): 325 mg via ORAL
  Filled 2015-07-25 (×3): qty 1

## 2015-07-25 MED ORDER — SENNOSIDES-DOCUSATE SODIUM 8.6-50 MG PO TABS
2.0000 | ORAL_TABLET | ORAL | Status: DC
  Administered 2015-07-26 – 2015-07-27 (×2): 2 via ORAL
  Filled 2015-07-25 (×2): qty 2

## 2015-07-25 MED ORDER — LACTATED RINGERS IV SOLN
INTRAVENOUS | Status: DC
  Administered 2015-07-25 (×3): via INTRAVENOUS

## 2015-07-25 MED ORDER — IBUPROFEN 600 MG PO TABS
600.0000 mg | ORAL_TABLET | Freq: Four times a day (QID) | ORAL | Status: DC
  Administered 2015-07-25 – 2015-07-27 (×8): 600 mg via ORAL
  Filled 2015-07-25 (×8): qty 1

## 2015-07-25 MED ORDER — OXYTOCIN 40 UNITS IN LACTATED RINGERS INFUSION - SIMPLE MED
1.0000 m[IU]/min | INTRAVENOUS | Status: DC
  Administered 2015-07-25: 2 m[IU]/min via INTRAVENOUS

## 2015-07-25 MED ORDER — EPHEDRINE 5 MG/ML INJ
10.0000 mg | INTRAVENOUS | Status: DC | PRN
  Filled 2015-07-25: qty 2

## 2015-07-25 MED ORDER — WITCH HAZEL-GLYCERIN EX PADS: 1.0000 "application " | MEDICATED_PAD | CUTANEOUS | Status: DC | PRN

## 2015-07-25 MED ORDER — FENTANYL 2.5 MCG/ML BUPIVACAINE 1/10 % EPIDURAL INFUSION (WH - ANES)
INTRAMUSCULAR | Status: AC
  Administered 2015-07-25: 14 mL/h via EPIDURAL
  Filled 2015-07-25: qty 125

## 2015-07-25 MED ORDER — OXYTOCIN BOLUS FROM INFUSION: 500.0000 mL | INTRAVENOUS | Status: DC

## 2015-07-25 MED ORDER — DIPHENHYDRAMINE HCL 25 MG PO CAPS: 25.0000 mg | ORAL_CAPSULE | Freq: Four times a day (QID) | ORAL | Status: DC | PRN

## 2015-07-25 MED ORDER — FENTANYL 2.5 MCG/ML BUPIVACAINE 1/10 % EPIDURAL INFUSION (WH - ANES)
14.0000 mL/h | INTRAMUSCULAR | Status: DC | PRN
  Administered 2015-07-25 (×2): 14 mL/h via EPIDURAL

## 2015-07-25 MED ORDER — MISOPROSTOL 25 MCG QUARTER TABLET
25.0000 ug | ORAL_TABLET | ORAL | Status: DC | PRN
  Filled 2015-07-25: qty 0.25
  Filled 2015-07-25: qty 1

## 2015-07-25 MED ORDER — DIBUCAINE 1 % RE OINT: 1.0000 "application " | TOPICAL_OINTMENT | RECTAL | Status: DC | PRN

## 2015-07-25 MED ORDER — DIPHENHYDRAMINE HCL 50 MG/ML IJ SOLN: 12.5000 mg | INTRAMUSCULAR | Status: DC | PRN

## 2015-07-25 MED ORDER — OXYCODONE-ACETAMINOPHEN 5-325 MG PO TABS: 1.0000 | ORAL_TABLET | ORAL | Status: DC | PRN

## 2015-07-25 MED ORDER — ONDANSETRON HCL 4 MG PO TABS: 4.0000 mg | ORAL_TABLET | ORAL | Status: DC | PRN

## 2015-07-25 MED ORDER — OXYCODONE-ACETAMINOPHEN 5-325 MG PO TABS
1.0000 | ORAL_TABLET | ORAL | Status: DC | PRN
  Administered 2015-07-25: 1 via ORAL
  Filled 2015-07-25: qty 1

## 2015-07-25 MED ORDER — ONDANSETRON HCL 4 MG/2ML IJ SOLN: 4.0000 mg | Freq: Four times a day (QID) | INTRAMUSCULAR | Status: DC | PRN

## 2015-07-25 MED ORDER — ACETAMINOPHEN 325 MG PO TABS
650.0000 mg | ORAL_TABLET | ORAL | Status: DC | PRN
Start: 2015-07-25 — End: 2015-07-25

## 2015-07-25 MED ORDER — BENZOCAINE-MENTHOL 20-0.5 % EX AERO: 1.0000 "application " | INHALATION_SPRAY | CUTANEOUS | Status: DC | PRN

## 2015-07-25 MED ORDER — OXYTOCIN 40 UNITS IN LACTATED RINGERS INFUSION - SIMPLE MED
62.5000 mL/h | INTRAVENOUS | Status: DC
  Administered 2015-07-25: 62.5 mL/h via INTRAVENOUS
  Filled 2015-07-25: qty 1000

## 2015-07-25 MED ORDER — PRENATAL MULTIVITAMIN CH
1.0000 | ORAL_TABLET | Freq: Every day | ORAL | Status: DC
  Administered 2015-07-26 – 2015-07-27 (×2): 1 via ORAL
  Filled 2015-07-25 (×2): qty 1

## 2015-07-25 MED ORDER — ZOLPIDEM TARTRATE 5 MG PO TABS: 5.0000 mg | ORAL_TABLET | Freq: Every evening | ORAL | Status: DC | PRN

## 2015-07-25 MED ORDER — TETANUS-DIPHTH-ACELL PERTUSSIS 5-2.5-18.5 LF-MCG/0.5 IM SUSP: 0.5000 mL | Freq: Once | INTRAMUSCULAR | Status: DC

## 2015-07-25 NOTE — Progress Notes (Addendum)
Labor Progress  Subjective: No complaints  Objective: BP 120/69 mmHg  Pulse 88  Temp(Src) 97.9 F (36.6 C) (Oral)  Resp 18  Ht 5\' 9"  (1.753 m)  Wt 180 lb (81.647 kg)  BMI 26.57 kg/m2  LMP 08/26/2014 (Approximate)     FHT: 135, moderate variability, + accel, no decel CTX:  regular, every 3-4 minutes Uterus gravid, soft non tender SVE:  Dilation: 4 Effacement (%): 50 Station: -2 Exam by:: V. Micaella Gitto CNM,  Assessment:  IUP at 37.0 weeks NICHD: Category 1 Membranes:  Intact Labor progress: IOL ZOX:[id number]:negative    Plan: Continue labor plan Continuous monitoring Ambulate Frequent position changes to facilitate fetal rotation and descent. Will reassess with cervical exam at 1200 or earlier if necessary Start pitocin 2x2 per protocol      Emilianna Barlowe, CNM, MSN 07/25/2015. 8:42 AM

## 2015-07-25 NOTE — Anesthesia Procedure Notes (Signed)
Epidural Patient location during procedure: OB Start time: 07/25/2015 1:00 PM End time: 07/25/2015 1:04 PM  Staffing Anesthesiologist: Leilani AbleHATCHETT, Shron Ozer Performed by: anesthesiologist   Preanesthetic Checklist Completed: patient identified, surgical consent, pre-op evaluation, timeout performed, IV checked, risks and benefits discussed and monitors and equipment checked  Epidural Patient position: sitting Prep: site prepped and draped and DuraPrep Patient monitoring: continuous pulse ox and blood pressure Approach: midline Location: L3-L4 Injection technique: LOR air  Needle:  Needle type: Tuohy  Needle gauge: 17 G Needle length: 9 cm and 9 Needle insertion depth: 6 cm Catheter type: closed end flexible Catheter size: 19 Gauge Catheter at skin depth: 10 cm Test dose: negative and Other  Assessment Sensory level: T9 Events: blood not aspirated, injection not painful, no injection resistance, negative IV test and no paresthesia  Additional Notes Reason for block:procedure for pain

## 2015-07-25 NOTE — Progress Notes (Signed)
Labor Progress  Subjective: Comfortable with epidural  Objective: BP 102/47 mmHg  Pulse 67  Temp(Src) 98.1 F (36.7 C) (Oral)  Resp 18  Ht 5\' 9"  (1.753 m)  Wt 180 lb (81.647 kg)  BMI 26.57 kg/m2  SpO2 99%  LMP 08/26/2014 (Approximate)     FHT: 140, moderate variabilty, + accel, no decel CTX:  irregular, every 3-5 minutes Uterus gravid, soft non tender SVE:  Dilation: 4.5 Effacement (%): 50 Station: -2 Exam by:: V. Gael Delude CNM Pitocin at 7416mUn/min  Assessment:  IUP at 37.0 weeks NICHD: Category Membranes:  AROM clear at 1515 Labor progress: IOL Pitocin Augmentation GBS: negative IUPC placed  Plan: Continue labor plan Continuous monitoring Rest Frequent position changes to facilitate fetal rotation and descent. Will reassess with cervical exam at 1800 or earlier if necessary Continue pitocin per protocol     Ontario Pettengill, CNM, MSN 07/25/2015. 3:19 PM

## 2015-07-25 NOTE — Anesthesia Preprocedure Evaluation (Signed)
Anesthesia Evaluation  Patient identified by MRN, date of birth, ID band Patient awake    Reviewed: Allergy & Precautions, H&P , NPO status , Patient's Chart, lab work & pertinent test results  Airway Mallampati: I  TM Distance: >3 FB Neck ROM: full    Dental no notable dental hx.    Pulmonary neg pulmonary ROS,    Pulmonary exam normal breath sounds clear to auscultation       Cardiovascular negative cardio ROS Normal cardiovascular exam     Neuro/Psych negative psych ROS   GI/Hepatic negative GI ROS, Neg liver ROS,   Endo/Other  negative endocrine ROS  Renal/GU      Musculoskeletal   Abdominal Normal abdominal exam  (+)   Peds  Hematology negative hematology ROS (+)   Anesthesia Other Findings   Reproductive/Obstetrics (+) Pregnancy                             Anesthesia Physical Anesthesia Plan  ASA: II  Anesthesia Plan: Epidural   Post-op Pain Management:    Induction:   Airway Management Planned:   Additional Equipment:   Intra-op Plan:   Post-operative Plan:   Informed Consent: I have reviewed the patients History and Physical, chart, labs and discussed the procedure including the risks, benefits and alternatives for the proposed anesthesia with the patient or authorized representative who has indicated his/her understanding and acceptance.     Plan Discussed with:   Anesthesia Plan Comments:         Anesthesia Quick Evaluation

## 2015-07-25 NOTE — Progress Notes (Signed)
Labor Progress  Subjective: No complaints  Objective: BP 121/73 mmHg  Pulse 86  Temp(Src) 97.9 F (36.6 C) (Oral)  Resp 18  Ht 5\' 9"  (1.753 m)  Wt 180 lb (81.647 kg)  BMI 26.57 kg/m2  LMP 08/26/2014 (Approximate)     FHT: 140, moderate variability, + accel, occasional variable decel CTX:  irregular, every 2-5 minutes Uterus gravid, soft non tender SVE:  Dilation: 4.5 Effacement (%): 50 Station: -2 Exam by:: V. Lyna Laningham CNM Pitocin at 912mUn/min  Assessment:  IUP at 37.0 weeks NICHD: Category 2 Membranes:  Intact Labor progress: IOL Cholestasis Pitocin Augmentation GBS: negative  Plan: Continue labor plan Continuous monitoring Rest/Ambulate Frequent position changes to facilitate fetal rotation and descent. Will reassess with cervical exam at 1500 or earlier if necessary Continue pitocin per protocol    Affan Callow, CNM, MSN 07/25/2015. 11:44 AM

## 2015-07-26 LAB — CBC
HCT: 25.1 % — ABNORMAL LOW (ref 36.0–46.0)
Hemoglobin: 8.3 g/dL — ABNORMAL LOW (ref 12.0–15.0)
MCH: 28.3 pg (ref 26.0–34.0)
MCHC: 33.1 g/dL (ref 30.0–36.0)
MCV: 85.7 fL (ref 78.0–100.0)
PLATELETS: 258 10*3/uL (ref 150–400)
RBC: 2.93 MIL/uL — AB (ref 3.87–5.11)
RDW: 13.3 % (ref 11.5–15.5)
WBC: 15.7 10*3/uL — ABNORMAL HIGH (ref 4.0–10.5)

## 2015-07-26 NOTE — Anesthesia Postprocedure Evaluation (Signed)
Anesthesia Post Note  Patient: Joann Marshall  Procedure(s) Performed: * No procedures listed *  Patient location during evaluation: Mother Baby Anesthesia Type: Epidural Level of consciousness: awake, awake and alert, oriented and patient cooperative Pain management: pain level controlled Vital Signs Assessment: post-procedure vital signs reviewed and stable Respiratory status: spontaneous breathing, nonlabored ventilation and respiratory function stable Cardiovascular status: stable Postop Assessment: no headache, no backache, patient able to bend at knees and no signs of nausea or vomiting Anesthetic complications: no    Last Vitals:  Filed Vitals:   07/26/15 0126 07/26/15 0545  BP: 117/66 101/61  Pulse: 82 70  Temp: 36.8 C 36.6 C  Resp: 20 14    Last Pain:  Filed Vitals:   07/26/15 0632  PainSc: 0-No pain                 Sukhman Kocher L

## 2015-07-26 NOTE — Lactation Note (Signed)
This note was copied from the chart of Joann Ladona Hornsllison Warr. Lactation Consultation Note New mom has short shaft nipples, bruised, after hand stimulation and manual pumping, nipples evert a little more. Shells given to wear in bra, manual pump given to pre-pump a minute to evert nipple.  Taught hand expression w/colostrum noted. Fitted mom for #20 NS, latched baby. Baby very sleepy. Hasn't eaten since 0100. Hand expression 2 ml colostrum. Suck training and stimulation to wake baby to BF. Finally started opening mouth and suckling. Inserted colostrum into NS, baby latched well. Suckle at intervals. Mom stated more comfort than no NS. Encouraged football hold, taught "C" hold and discussed positioning w/support. Referred to Baby and Me Book in Breastfeeding section Pg. 22-23 for position options and Proper latch demonstration. Mom encouraged to feed baby 8-12 times/24 hours and with feeding cues. Mom shown how to use DEBP & how to disassemble, clean, & reassemble parts. Mom knows to pump q3h for 15-20 min. LPI newborn information sheet given. Discussed I&O, cluster feeding, supply and demand, importance of post pumping. Mother informed of post-discharge support and given phone number to the lactation department, including services for phone call assistance; out-patient appointments; and breastfeeding support group. List of other breastfeeding resources in the community given in the handout. Encouraged mother to call for problems or concerns related to breastfeeding. Patient Name: Joann Marshall ZOXWR'UToday's Date: 07/26/2015 Reason for consult: Initial assessment;Late preterm infant   Maternal Data Has patient been taught Hand Expression?: Yes Does the patient have breastfeeding experience prior to this delivery?: No  Feeding Feeding Type: Breast Milk Length of feed: 15 min (still BF)  LATCH Score/Interventions Latch: Repeated attempts needed to sustain latch, nipple held in mouth throughout feeding,  stimulation needed to elicit sucking reflex. Intervention(s): Skin to skin;Teach feeding cues;Waking techniques Intervention(s): Adjust position;Assist with latch;Breast massage;Breast compression  Audible Swallowing: A few with stimulation Intervention(s): Skin to skin;Hand expression Intervention(s): Hand expression;Alternate breast massage  Type of Nipple: Everted at rest and after stimulation (short shaft)  Comfort (Breast/Nipple): Filling, red/small blisters or bruises, mild/mod discomfort  Problem noted: Mild/Moderate discomfort Interventions (Mild/moderate discomfort): Post-pump;Breast shields;Pre-pump if needed;Hand massage;Hand expression  Hold (Positioning): Assistance needed to correctly position infant at breast and maintain latch. Intervention(s): Breastfeeding basics reviewed;Support Pillows;Position options;Skin to skin  LATCH Score: 6  Lactation Tools Discussed/Used Tools: Shells;Nipple Dorris CarnesShields;Pump Nipple shield size: 20 Shell Type: Inverted Breast pump type: Double-Electric Breast Pump WIC Program: No Pump Review: Setup, frequency, and cleaning;Milk Storage Initiated by:: Peri JeffersonL. Raevin Wierenga RN Date initiated:: 07/26/15   Consult Status Consult Status: Follow-up Date: 07/27/15 Follow-up type: In-patient    Murriel Holwerda, Diamond NickelLAURA G 07/26/2015, 9:05 AM

## 2015-07-26 NOTE — Progress Notes (Signed)
Subjective: Postpartum Day 1: Vaginal delivery, 2 degree and bilateral periurethral laceration laceration Patient up ad lib, reports no syncope or dizziness. Feeding:  breast Contraceptive plan:  Undecided- discussed Progesterone based contraception options Desired to be discharged after 24hrs - however, infant not feeding well and will be observed overnight  Objective: Vital signs in last 24 hours: Temp:  [97.9 F (36.6 C)-98.5 F (36.9 C)] 98.5 F (36.9 C) (12/27 1801) Pulse Rate:  [70-105] 94 (12/27 1801) Resp:  [14-20] 18 (12/27 1801) BP: (101-127)/(58-87) 109/73 mmHg (12/27 1801) SpO2:  [100 %] 100 % (12/27 0545)  Physical Exam:  General: alert and cooperative Lochia: appropriate Uterine Fundus: firm Perineum: healing well DVT Evaluation: No evidence of DVT seen on physical exam. Negative Homan's sign.   CBC Latest Ref Rng 07/26/2015 07/25/2015 04/21/2014  WBC 4.0 - 10.5 K/uL 15.7(H) 14.5(H) -  Hemoglobin 12.0 - 15.0 g/dL 8.3(L) 9.7(L) 13.1  Hematocrit 36.0 - 46.0 % 25.1(L) 29.2(L) -  Platelets 150 - 400 K/uL 258 275 -     Assessment/Plan: Status post vaginal delivery day 2. Stable Breastfeeding Undecided for [employer]son[employer]irth Control - options reviewed  Continue current care. Plan for discharge tomorrow    Beatrix FettersRachel StallCNM 07/26/2015, 6:38 PM

## 2015-07-27 MED ORDER — PRENATAL MULTIVITAMIN CH: 1.0000 | ORAL_TABLET | Freq: Every day | ORAL | Status: DC

## 2015-07-27 MED ORDER — FERROUS SULFATE 325 (65 FE) MG PO TABS: 325.0000 mg | ORAL_TABLET | Freq: Two times a day (BID) | ORAL | Status: DC

## 2015-07-27 MED ORDER — IBUPROFEN 600 MG PO TABS: 600.0000 mg | ORAL_TABLET | Freq: Four times a day (QID) | ORAL | Status: DC

## 2015-07-27 MED ORDER — SENNOSIDES-DOCUSATE SODIUM 8.6-50 MG PO TABS: 2.0000 | ORAL_TABLET | Freq: Every evening | ORAL | Status: DC | PRN

## 2015-07-27 NOTE — Lactation Note (Signed)
This note was copied from the chart of Joann Ladona Hornsllison Posner. Lactation Consultation Note  Parents called to view feeding.  Mother latched baby with #20NS easily. Helped mother achieve more depth.  Sucks and swallows observed for 10 min. Suggest mother try at least once a day to breastfeed without nipple shield to try and wean off NS. Offered OP appointment but mother states she will call if needed. Encouraged parents to continue to offer breastmilk supplementation until weight stabilizes or mother is pumping 20 ml. Recommend parents switch supplementation to slow flow bottle after today. Encouraged parents to call with questions as needed.   Patient Name: Joann Marshall ZOXWR'UToday's Date: 07/27/2015 Reason for consult: Follow-up assessment   Maternal Data    Feeding Feeding Type: Breast Fed  LATCH Score/Interventions Latch: Grasps breast easily, tongue down, lips flanged, rhythmical sucking.  Audible Swallowing: Spontaneous and intermittent  Type of Nipple: Everted at rest and after stimulation  Comfort (Breast/Nipple): Filling, red/small blisters or bruises, mild/mod discomfort  Problem noted: Mild/Moderate discomfort  Hold (Positioning): No assistance needed to correctly position infant at breast.  LATCH Score: 9  Lactation Tools Discussed/Used Tools: Nipple Shields Nipple shield size: 20   Consult Status Consult Status: Complete Date: 07/27/15 Follow-up type: In-patient    Joann Marshall, Joann Marshall 07/27/2015, 8:53 AM

## 2015-07-27 NOTE — Lactation Note (Signed)
This note was copied from the chart of Joann Ladona Hornsllison Pizzolato. Lactation Consultation Note  Baby 37 weeks < 6 lbs.  4.6% weight loss. Mother has been pumping and giving colostrum to baby with syringe after feedings. Discussed LPI feeding behavior and the need to continue pumping and supplementing. Suggest mother post pump 4-6 x day for 10-15 min and give baby back volume pumped. Described future potential of supplementing w/ formula if needed. Mother will call to view next feeding. Reviewed engorgement care and monitoring voids/stools.   Patient Name: Joann Marshall     Maternal Data    Feeding    LATCH Score/Interventions                      Lactation Tools Discussed/Used     Consult Status      Joann Marshall, Joann Marshall Marshall, 8:21 AM

## 2015-07-27 NOTE — Discharge Summary (Signed)
OB Discharge Summary  Patient Name: Joann Marshall DOB: 11-13-1990 MRN: [id number]  Date of admission: 07/25/2015 Delivering MD: Adelina Mings   Date of discharge: 07/27/2015  Admitting diagnosis: INDUCTION Intrauterine pregnancy: [redacted]w[redacted]d     Secondary diagnosis:Principal Problem:   Vaginal delivery Active Problems:   Cholestasis of pregnancy, antepartum   Normal vaginal delivery  Additional problems:none     Discharge diagnosis: Term Pregnancy Delivered                                                                     Post partum procedures:none  Augmentation: Pitocin  Complications: None  Hospital course:  Induction of Labor With Vaginal Delivery   24 y.o. yo G2P1011 at [redacted]w[redacted]d was admitted to the hospital 07/25/2015 for induction of labor.  Indication for induction: Cholestasis of pregnancy.  Patient had an uncomplicated labor course as follows: Membrane Rupture Time/Date: 3:13 PM ,07/25/2015   Intrapartum Procedures: Episiotomy: None [1]                                         Lacerations:  Periurethral [8];2nd degree [3];Perineal [11]  Patient had delivery of a Viable infant.  Information for the patient's newborn:  Lorree, Millar [[id number]]  Delivery Method: Vag-Spont   07/25/2015  Details of delivery can be found in separate delivery note.  Patient had a routine postpartum course. Patient is discharged home No discharge date for patient encounter.    Physical exam  Filed Vitals:   07/26/15 0126 07/26/15 0545 07/26/15 1801 07/27/15 0644  BP: 117/66 101/61 109/73 106/62  Pulse: 82 70 94 61  Temp: 98.2 F (36.8 C) 97.9 F (36.6 C) 98.5 F (36.9 C) 98 F (36.7 C)  TempSrc: Oral Oral Oral Oral  Resp: 20 14 18 18   Height:      Weight:      SpO2:  100%     General: alert and cooperative Lochia: appropriate Uterine Fundus: firm Incision: Healing well with no significant drainage DVT Evaluation: No evidence of DVT seen on physical  exam. Labs: Lab Results  Component Value Date   WBC 15.7* 07/26/2015   HGB 8.3* 07/26/2015   HCT 25.1* 07/26/2015   MCV 85.7 07/26/2015   PLT 258 07/26/2015   CMP Latest Ref Rng 04/17/2014  Glucose 70 - 99 mg/dL 086(V)  BUN 6 - 23 mg/dL 12  Creatinine 7.84 - 6.96 mg/dL 2.95  Sodium 284 - 132 mEq/L 137  Potassium 3.7 - 5.3 mEq/L 3.2(L)  Chloride 96 - 112 mEq/L 100  CO2 19 - 32 mEq/L 24  Calcium 8.4 - 10.5 mg/dL 9.1    Discharge instruction: per After Visit Summary and "Baby and Me Booklet".  Medications:  Current facility-administered medications:  .  acetaminophen (TYLENOL) tablet 650 mg, 650 mg, Oral, Q4H PRN, Dhrithi Riche, CNM .  benzocaine-Menthol (DERMOPLAST) 20-0.5 % topical spray 1 application, 1 application, Topical, PRN, Debraann Livingstone, CNM .  witch hazel-glycerin (TUCKS) pad 1 application, 1 application, Topical, PRN **AND** dibucaine (NUPERCAINAL) 1 % rectal ointment 1 application, 1 application, Rectal, PRN, Mckinzi Eriksen, CNM .  diphenhydrAMINE (BENADRYL) capsule 25  mg, 25 mg, Oral, Q6H PRN, Ellouise Mcwhirter, CNM .  ferrous sulfate tablet 325 mg, 325 mg, Oral, BID WC, Scottie Metayer, CNM, 325 mg at 07/26/15 1751 .  ibuprofen (ADVIL,MOTRIN) tablet 600 mg, 600 mg, Oral, 4 times per day, Shanetta Nicolls, CNM, 600 mg at 07/27/15 0625 .  lanolin ointment, , Topical, PRN, Shabrea Weldin, CNM .  lidocaine (PF) (XYLOCAINE) 1 % injection 30 mL, 30 mL, Subcutaneous, PRN, Jaymes Graff, MD, 30 mL at 07/25/15 1805 .  ondansetron (ZOFRAN) tablet 4 mg, 4 mg, Oral, Q4H PRN **OR** ondansetron (ZOFRAN) injection 4 mg, 4 mg, Intravenous, Q4H PRN, Ngoc Detjen, CNM .  oxyCODONE-acetaminophen (PERCOCET/ROXICET) 5-325 MG per tablet 1 tablet, 1 tablet, Oral, Q4H PRN, Remmie Bembenek, CNM .  oxyCODONE-acetaminophen (PERCOCET/ROXICET) 5-325 MG per tablet 2 tablet, 2 tablet, Oral, Q4H PRN, Nyko Gell, CNM .  prenatal multivitamin tablet 1 tablet, 1 tablet, Oral, Q1200, Sybel Standish, CNM,  1 tablet at 07/26/15 1208 .  senna-docusate (Senokot-S) tablet 2 tablet, 2 tablet, Oral, Q24H, Azharia Surratt, CNM, 2 tablet at 07/27/15 0036 .  simethicone (MYLICON) chewable tablet 80 mg, 80 mg, Oral, PRN, Chaniyah Jahr, CNM .  Tdap (BOOSTRIX) injection 0.5 mL, 0.5 mL, Intramuscular, Once, Irianna Gilday, CNM, 0.5 mL at 07/26/15 1751 .  zolpidem (AMBIEN) tablet 5 mg, 5 mg, Oral, QHS PRN, Davieon Stockham, CNM After Visit Meds:    Medication List    ASK your doctor about these medications        hydrOXYzine 25 MG tablet  Commonly known as:  ATARAX/VISTARIL  Take 25 mg by mouth every 6 (six) hours as needed for itching.     ursodiol 300 MG capsule  Commonly known as:  ACTIGALL  Take 300 mg by mouth 2 (two) times daily.        Diet: routine diet  Activity: Advance as tolerated. Pelvic rest for 6 weeks.   Outpatient follow up:6 weeks Follow up Appt:No future appointments. Follow up visit: No Follow-up on file.  Postpartum contraception: Undecided  Newborn Data: Live born female  Birth Weight: 6 lb 3.1 oz (2810 g) APGAR: 8, 9  Baby Feeding: Breast Disposition:home with mother   07/27/2015 Laurali Goddard, CNM      Postpartum Care After Vaginal Delivery  After you deliver your newborn (postpartum period), the usual stay in the hospital is 24 72 hours. If there were problems with your labor or delivery, or if you have other medical problems, you might be in the hospital longer.  While you are in the hospital, you will receive help and instructions on how to care for yourself and your newborn during the postpartum period.  While you are in the hospital:  Be sure to tell your nurses if you have pain or discomfort, as well as where you feel the pain and what makes the pain worse.  If you had an incision made near your vagina (episiotomy) or if you had some tearing during delivery, the nurses may put ice packs on your episiotomy or tear. The ice packs may help to reduce  the pain and swelling.  If you are breastfeeding, you may feel uncomfortable contractions of your uterus for a couple of weeks. This is normal. The contractions help your uterus get back to normal size.  It is normal to have some bleeding after delivery.  For the first 1 3 days after delivery, the flow is red and the amount may be similar to a period.  It is common for the  flow to start and stop.  In the first few days, you may pass some small clots. Let your nurses know if you begin to pass large clots or your flow increases.  Do not  flush blood clots down the toilet before having the nurse look at them.  During the next 3 10 days after delivery, your flow should become more watery and pink or brown-tinged in color.  Ten to fourteen days after delivery, your flow should be a small amount of yellowish-white discharge.  The amount of your flow will decrease over the first few weeks after delivery. Your flow may stop in 6 8 weeks. Most women have had their flow stop by 12 weeks after delivery.  You should change your sanitary pads frequently.  Wash your hands thoroughly with soap and water for at least 20 seconds after changing pads, using the toilet, or before holding or feeding your newborn.  You should feel like you need to empty your bladder within the first 6 8 hours after delivery.  In case you become weak, lightheaded, or faint, call your nurse before you get out of bed for the first time and before you take a shower for the first time.  Within the first few days after delivery, your breasts may begin to feel tender and full. This is called engorgement. Breast tenderness usually goes away within 48 72 hours after engorgement occurs. You may also notice milk leaking from your breasts. If you are not breastfeeding, do not stimulate your breasts. Breast stimulation can make your breasts produce more milk.  Spending as much time as possible with your newborn is very important. During  this time, you and your newborn can feel close and get to know each other. Having your newborn stay in your room (rooming in) will help to strengthen the bond with your newborn. It will give you time to get to know your newborn and become comfortable caring for your newborn.  Your hormones change after delivery. Sometimes the hormone changes can temporarily cause you to feel sad or tearful. These feelings should not last more than a few days. If these feelings last longer than that, you should talk to your caregiver.  If desired, talk to your caregiver about methods of family planning or contraception.  Talk to your caregiver about immunizations. Your caregiver may want you to have the following immunizations before leaving the hospital:  Tetanus, diphtheria, and pertussis (Tdap) or tetanus and diphtheria (Td) immunization. It is very important that you and your family (including grandparents) or others caring for your newborn are up-to-date with the Tdap or Td immunizations. The Tdap or Td immunization can help protect your newborn from getting ill.  Rubella immunization.  Varicella (chickenpox) immunization.  Influenza immunization. You should receive this annual immunization if you did not receive the immunization during your pregnancy. Document Released: 05/13/2007 Document Revised: 04/09/2012 Document Reviewed: 03/12/2012 [facility] Patient Information 2014 [place], [place].   Postpartum Depression and Baby Blues  The postpartum period begins right after the birth of a baby. During this time, there is often a great amount of joy and excitement. It is also a time of considerable changes in the life of the parent(s). Regardless of how many times a mother gives birth, each child brings new challenges and dynamics to the family. It is not unusual to have feelings of excitement accompanied by confusing shifts in moods, emotions, and thoughts. All mothers are at risk of developing postpartum  depression or the "baby blues."  These mood changes can occur right after giving birth, or they may occur many months after giving birth. The baby blues or postpartum depression can be mild or severe. Additionally, postpartum depression can resolve rather quickly, or it can be a long-term condition. CAUSES Elevated hormones and their rapid decline are thought to be a main cause of postpartum depression and the baby blues. There are a number of hormones that radically change during and after pregnancy. Estrogen and progesterone usually decrease immediately after delivering your baby. The level of thyroid hormone and various cortisol steroids also rapidly drop. Other factors that play a major role in these changes include major life events and genetics.  RISK FACTORS If you have any of the following risks for the baby blues or postpartum depression, know what symptoms to watch out for during the postpartum period. Risk factors that may increase the likelihood of getting the baby blues or postpartum depression include: 1. Havinga personal or family history of depression. 2. Having depression while being pregnant. 3. Having premenstrual or oral contraceptive-associated mood issues. 4. Having exceptional life stress. 5. Having marital conflict. 6. Lacking a social support network. 7. Having a baby with special needs. 8. Having health problems such as diabetes. SYMPTOMS Baby blues symptoms include:  Brief fluctuations in mood, such as going from extreme happiness to sadness.  Decreased concentration.  Difficulty sleeping.  Crying spells, tearfulness.  Irritability.  Anxiety. Postpartum depression symptoms typically begin within the first month after giving birth. These symptoms include:  Difficulty sleeping or excessive sleepiness.  Marked weight loss.  Agitation.  Feelings of worthlessness.  Lack of interest in activity or food. Postpartum psychosis is a very concerning condition and  can be dangerous. Fortunately, it is rare. Displaying any of the following symptoms is cause for immediate medical attention. Postpartum psychosis symptoms include:  Hallucinations and delusions.  Bizarre or disorganized behavior.  Confusion or disorientation. DIAGNOSIS  A diagnosis is made by an evaluation of your symptoms. There are no medical or lab tests that lead to a diagnosis, but there are various questionnaires that a caregiver may use to identify those with the baby blues, postpartum depression, or psychosis. Often times, a screening tool called the [place] Postnatal Depression Scale is used to diagnose depression in the postpartum period.  TREATMENT The baby blues usually goes away on its own in 1 to 2 weeks. Social support is often all that is needed. You should be encouraged to get adequate sleep and rest. Occasionally, you may be given medicines to help you sleep.  Postpartum depression requires treatment as it can last several months or longer if it is not treated. Treatment may include individual or group therapy, medicine, or both to address any social, physiological, and psychological factors that may play a role in the depression. Regular exercise, a healthy diet, rest, and social support may also be strongly recommended.  Postpartum psychosis is more serious and needs treatment right away. Hospitalization is often needed. HOME CARE INSTRUCTIONS  Get as much rest as you can. Nap when the baby sleeps.  Exercise regularly. Some women find yoga and walking to be beneficial.  Eat a balanced and nourishing diet.  Do little things that you enjoy. Have a cup of tea, take a bubble bath, read your favorite magazine, or listen to your favorite music.  Avoid alcohol.  Ask for help with household chores, cooking, grocery shopping, or running errands as needed. Do not try to do everything.  Talk to people close to  you about how you are feeling. Get support from your partner, family  members, friends, or other new moms.  Try to stay positive in how you think. Think about the things you are grateful for.  Do not spend a lot of time alone.  Only take medicine as directed by your caregiver.  Keep all your postpartum appointments.  Let your caregiver know if you have any concerns. SEEK MEDICAL CARE IF: You are having a reaction or problems with your medicine. SEEK IMMEDIATE MEDICAL CARE IF:  You have suicidal feelings.  You feel you may harm the baby or someone else. Document Released: 04/19/2004 Document Revised: 10/08/2011 Document Reviewed: 05/22/2011 [facility] Patient Information 2014 [place], [place].     Breastfeeding Deciding to breastfeed is one of the best choices you can make for you and your baby. A change in hormones during pregnancy causes your breast tissue to grow and increases the number and size of your milk ducts. These hormones also allow proteins, sugars, and fats from your blood supply to make breast milk in your milk-producing glands. Hormones prevent breast milk from being released before your baby is born as well as prompt milk flow after birth. Once breastfeeding has begun, thoughts of your baby, as well as his or her sucking or crying, can stimulate the release of milk from your milk-producing glands.  BENEFITS OF BREASTFEEDING For Your Baby  Your first milk (colostrum) helps your baby's digestive system function better.   There are antibodies in your milk that help your baby fight off infections.   Your baby has a lower incidence of asthma, allergies, and sudden infant death syndrome.   The nutrients in breast milk are better for your baby than infant formulas and are designed uniquely for your baby's needs.   Breast milk improves your baby's brain development.   Your baby is less likely to develop other conditions, such as childhood obesity, asthma, or type 2 diabetes mellitus.  For You   Breastfeeding helps to create a very  special bond between you and your baby.   Breastfeeding is convenient. Breast milk is always available at the correct temperature and costs nothing.   Breastfeeding helps to burn calories and helps you lose the weight gained during pregnancy.   Breastfeeding makes your uterus contract to its prepregnancy size faster and slows bleeding (lochia) after you give birth.   Breastfeeding helps to lower your risk of developing type 2 diabetes mellitus, osteoporosis, and breast or ovarian cancer later in life. SIGNS THAT YOUR BABY IS HUNGRY Early Signs of Hunger  Increased alertness or activity.  Stretching.  Movement of the head from side to side.  Movement of the head and opening of the mouth when the corner of the mouth or cheek is stroked (rooting).  Increased sucking sounds, smacking lips, cooing, sighing, or squeaking.  Hand-to-mouth movements.  Increased sucking of fingers or hands. Late Signs of Hunger  Fussing.  Intermittent crying. Extreme Signs of Hunger Signs of extreme hunger will require calming and consoling before your baby will be able to breastfeed successfully. Do not wait for the following signs of extreme hunger to occur before you initiate breastfeeding:   Restlessness.  A loud, strong cry.   Screaming.   BREASTFEEDING BASICS Breastfeeding Initiation  Find a comfortable place to sit or lie down, with your neck and back well supported.  Place a pillow or rolled up blanket under your baby to bring him or her to the level of your breast (if  you are seated). Nursing pillows are specially designed to help support your arms and your baby while you breastfeed.  Make sure that your baby's abdomen is facing your abdomen.   Gently massage your breast. With your fingertips, massage from your chest wall toward your nipple in a circular motion. This encourages milk flow. You may need to continue this action during the feeding if your milk flows  slowly.  Support your breast with 4 fingers underneath and your thumb above your nipple. Make sure your fingers are well away from your nipple and your baby's mouth.   Stroke your baby's lips gently with your finger or nipple.   When your baby's mouth is open wide enough, quickly bring your baby to your breast, placing your entire nipple and as much of the colored area around your nipple (areola) as possible into your baby's mouth.   More areola should be visible above your baby's upper lip than below the lower lip.   Your baby's tongue should be between his or her lower gum and your breast.   Ensure that your baby's mouth is correctly positioned around your nipple (latched). Your baby's lips should create a seal on your breast and be turned out (everted).  It is common for your baby to suck about 2-3 minutes in order to start the flow of breast milk. Latching Teaching your baby how to latch on to your breast properly is very important. An improper latch can cause nipple pain and decreased milk supply for you and poor weight gain in your baby. Also, if your baby is not latched onto your nipple properly, he or she may swallow some air during feeding. This can make your baby fussy. Burping your baby when you switch breasts during the feeding can help to get rid of the air. However, teaching your baby to latch on properly is still the best way to prevent fussiness from swallowing air while breastfeeding. Signs that your baby has successfully latched on to your nipple:    Silent tugging or silent sucking, without causing you pain.   Swallowing heard between every 3-4 sucks.    Muscle movement above and in front of his or her ears while sucking.  Signs that your baby has not successfully latched on to nipple:   Sucking sounds or smacking sounds from your baby while breastfeeding.  Nipple pain. If you think your baby has not latched on correctly, slip your finger into the corner of  your baby's mouth to break the suction and place it between your baby's gums. Attempt breastfeeding initiation again. Signs of Successful Breastfeeding Signs from your baby:   A gradual decrease in the number of sucks or complete cessation of sucking.   Falling asleep.   Relaxation of his or her body.   Retention of a small amount of milk in his or her mouth.   Letting go of your breast by himself or herself. Signs from you:  Breasts that have increased in firmness, weight, and size 1-3 hours after feeding.   Breasts that are softer immediately after breastfeeding.  Increased milk volume, as well as a change in milk consistency and color by the fifth day of breastfeeding.   Nipples that are not sore, cracked, or bleeding. Signs That Your Pecola Leisure is Getting Enough Milk  Wetting at least 3 diapers in a 24-hour period. The urine should be clear and pale yellow by age 109 days.  At least 3 stools in a 24-hour period by age 16  days. The stool should be soft and yellow.  At least 3 stools in a 24-hour period by age 26 days. The stool should be seedy and yellow.  No loss of weight greater than 10% of birth weight during the first 110 days of age.  Average weight gain of 4-7 ounces (113-198 g) per week after age 11 days.  Consistent daily weight gain by age 26 days, without weight loss after the age of 2 weeks. After a feeding, your baby may spit up a small amount. This is common. BREASTFEEDING FREQUENCY AND DURATION Frequent feeding will help you make more milk and can prevent sore nipples and breast engorgement. Breastfeed when you feel the need to reduce the fullness of your breasts or when your baby shows signs of hunger. This is called "breastfeeding on demand." Avoid introducing a pacifier to your baby while you are working to establish breastfeeding (the first 4-6 weeks after your baby is born). After this time you may choose to use a pacifier. Research has shown that pacifier use  during the first year of a baby's life decreases the risk of sudden infant death syndrome (SIDS). Allow your baby to feed on each breast as long as he or she wants. Breastfeed until your baby is finished feeding. When your baby unlatches or falls asleep while feeding from the first breast, offer the second breast. Because newborns are often sleepy in the first few weeks of life, you may need to awaken your baby to get him or her to feed. Breastfeeding times will vary from baby to baby. However, the following rules can serve as a guide to help you ensure that your baby is properly fed:  Newborns (babies 56 weeks of age or younger) may breastfeed every 1-3 hours.  Newborns should not go longer than 3 hours during the day or 5 hours during the night without breastfeeding.  You should breastfeed your baby a minimum of 8 times in a 24-hour period until you begin to introduce solid foods to your baby at around 35 months of age. BREAST MILK PUMPING Pumping and storing breast milk allows you to ensure that your baby is exclusively fed your breast milk, even at times when you are unable to breastfeed. This is especially important if you are going back to work while you are still breastfeeding or when you are not able to be present during feedings. Your lactation consultant can give you guidelines on how long it is safe to store breast milk.  A breast pump is a machine that allows you to pump milk from your breast into a sterile bottle. The pumped breast milk can then be stored in a refrigerator or freezer. Some breast pumps are operated by hand, while others use electricity. Ask your lactation consultant which type will work best for you. Breast pumps can be purchased, but some hospitals and breastfeeding support groups lease breast pumps on a monthly basis. A lactation consultant can teach you how to hand express breast milk, if you prefer not to use a pump.  CARING FOR YOUR BREASTS WHILE YOU BREASTFEED Nipples  can become dry, cracked, and sore while breastfeeding. The following recommendations can help keep your breasts moisturized and healthy:  Avoid using soap on your nipples.   Wear a supportive bra. Although not required, special nursing bras and tank tops are designed to allow access to your breasts for breastfeeding without taking off your entire bra or top. Avoid wearing underwire-style bras or extremely tight bras.  Air dry your nipples for 3-49minutes after each feeding.   Use only cotton bra pads to absorb leaked breast milk. Leaking of breast milk between feedings is normal.   Use lanolin on your nipples after breastfeeding. Lanolin helps to maintain your skin's normal moisture barrier. If you use pure lanolin, you do not need to wash it off before feeding your baby again. Pure lanolin is not toxic to your baby. You may also hand express a few drops of breast milk and gently massage that milk into your nipples and allow the milk to air dry. In the first few weeks after giving birth, some women experience extremely full breasts (engorgement). Engorgement can make your breasts feel heavy, warm, and tender to the touch. Engorgement peaks within 3-5 days after you give birth. The following recommendations can help ease engorgement:  Completely empty your breasts while breastfeeding or pumping. You may want to start by applying warm, moist heat (in the shower or with warm water-soaked hand towels) just before feeding or pumping. This increases circulation and helps the milk flow. If your baby does not completely empty your breasts while breastfeeding, pump any extra milk after he or she is finished.  Wear a snug bra (nursing or regular) or tank top for 1-2 days to signal your body to slightly decrease milk production.  Apply ice packs to your breasts, unless this is too uncomfortable for you.  Make sure that your baby is latched on and positioned properly while breastfeeding. If engorgement  persists after 48 hours of following these recommendations, contact your health care provider or a Advertising copywriter. OVERALL HEALTH CARE RECOMMENDATIONS WHILE BREASTFEEDING  Eat healthy foods. Alternate between meals and snacks, eating 3 of each per day. Because what you eat affects your breast milk, some of the foods may make your baby more irritable than usual. Avoid eating these foods if you are sure that they are negatively affecting your baby.  Drink milk, fruit juice, and water to satisfy your thirst (about 10 glasses a day).   Rest often, relax, and continue to take your prenatal vitamins to prevent fatigue, stress, and anemia.  Continue breast self-awareness checks.  Avoid chewing and smoking tobacco.  Avoid alcohol and drug use. Some medicines that may be harmful to your baby can pass through breast milk. It is important to ask your health care provider before taking any medicine, including all over-the-counter and prescription medicine as well as vitamin and herbal supplements. It is possible to become pregnant while breastfeeding. If birth control is desired, ask your health care provider about options that will be safe for your baby. SEEK MEDICAL CARE IF:   You feel like you want to stop breastfeeding or have become frustrated with breastfeeding.  You have painful breasts or nipples.  Your nipples are cracked or bleeding.  Your breasts are red, tender, or warm.  You have a swollen area on either breast.  You have a fever or chills.  You have nausea or vomiting.  You have drainage other than breast milk from your nipples.  Your breasts do not become full before feedings by the fifth day after you give birth.  You feel sad and depressed.  Your baby is too sleepy to eat well.  Your baby is having trouble sleeping.   Your baby is wetting less than 3 diapers in a 24-hour period.  Your baby has less than 3 stools in a 24-hour period.  Your baby's skin or the  white part of  his or her eyes becomes yellow.   Your baby is not gaining weight by 31 days of age. SEEK IMMEDIATE MEDICAL CARE IF:   Your baby is overly tired (lethargic) and does not want to wake up and feed.  Your baby develops an unexplained fever. Document Released: 07/16/2005 Document Revised: 07/21/2013 Document Reviewed: 01/07/2013 [facility] Patient Information 2015 [place], [place]. This information is not intended to replace advice given to you by your health care provider. Make sure you discuss any questions you have with your health care provider.

## 2015-07-29 LAB — TYPE AND SCREEN
ABO/RH(D): O NEG
Antibody Screen: POSITIVE
DAT, IgG: NEGATIVE
UNIT DIVISION: 0
Unit division: 0

## 2015-08-15 ENCOUNTER — Inpatient Hospital Stay (HOSPITAL_COMMUNITY)
Admission: AD | Admit: 2015-08-15 | Payer: BLUE CROSS/BLUE SHIELD | Source: Ambulatory Visit | Admitting: Obstetrics & Gynecology

## 2016-03-16 IMAGING — US US OB TRANSVAGINAL
1 series · 14 of 28 positions shown · non-contrast
Comparison: None.

CLINICAL DATA: Abdominal pain, heavy bleeding. Estimated
gestational age by last menstrual period equals of 11 weeks 6 days.

EXAM:
OBSTETRIC <14 WK US AND TRANSVAGINAL OB US
TECHNIQUE: Both transabdominal and transvaginal ultrasound examinations were
performed for complete evaluation of the gestation as well as the
maternal uterus, adnexal regions, and pelvic cul-de-sac.
Transvaginal technique was performed to assess early pregnancy.

[Series 1: us ob transvaginal · 0.21mm/px · 14 of 66 slices shown]
[im 3/66]
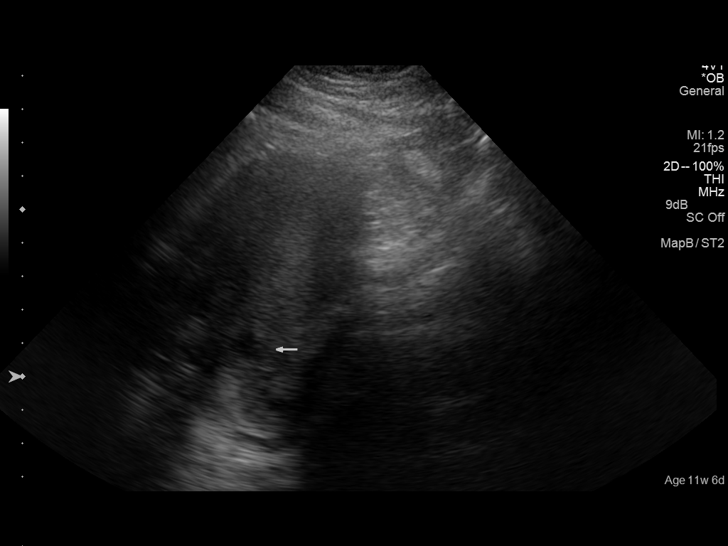
[im 8/66]
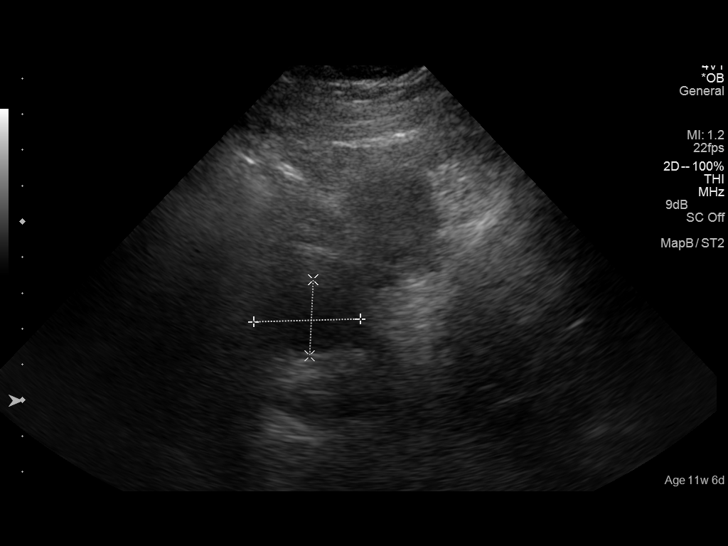
[im 13/66]
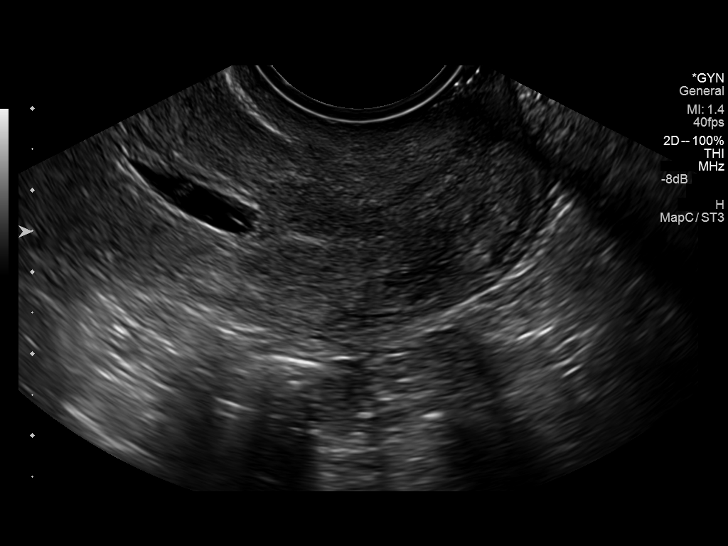
[im 17/66]
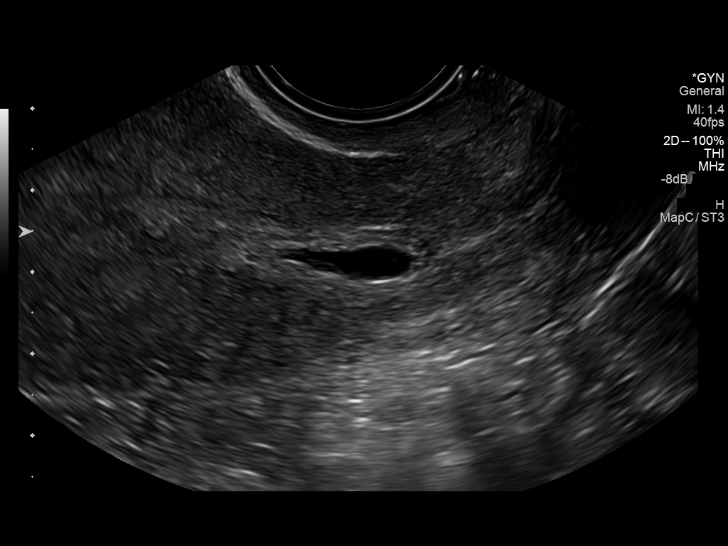
[im 22/66]
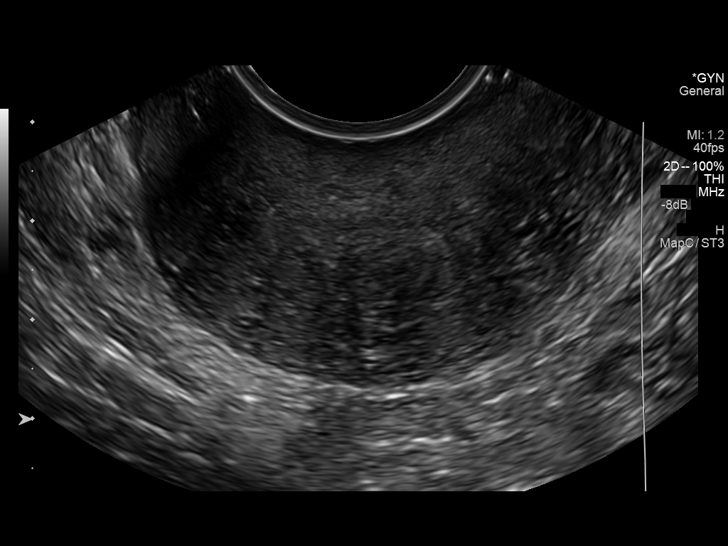
[im 27/66]
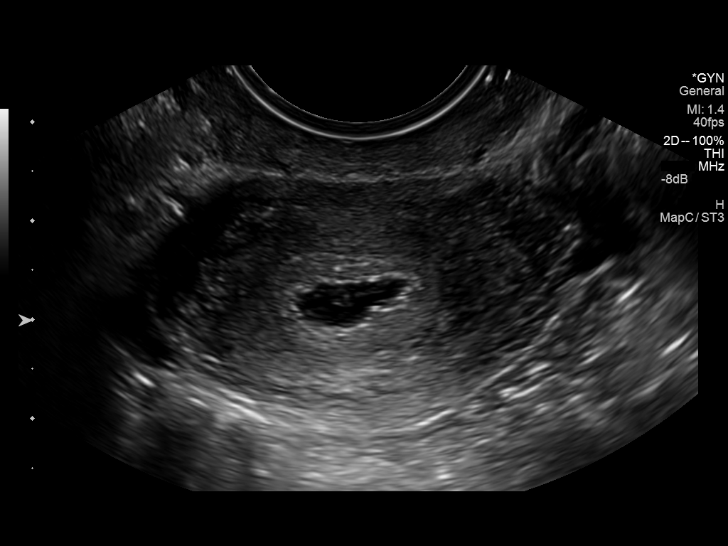
[im 32/66]
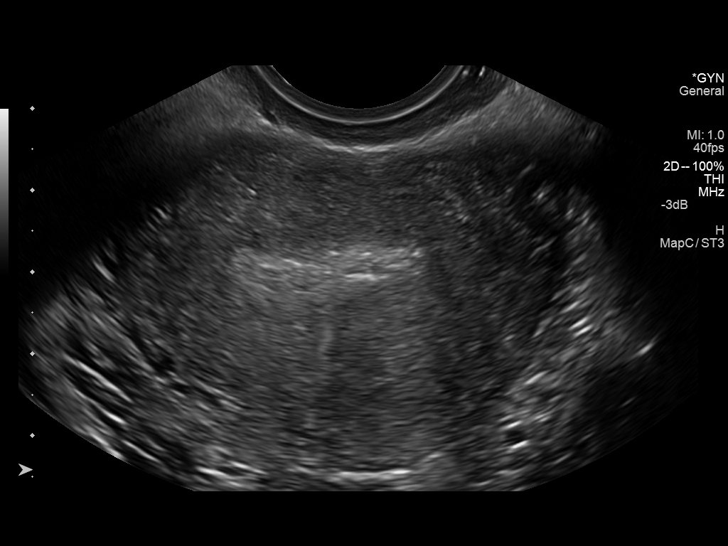
[im 37/66]
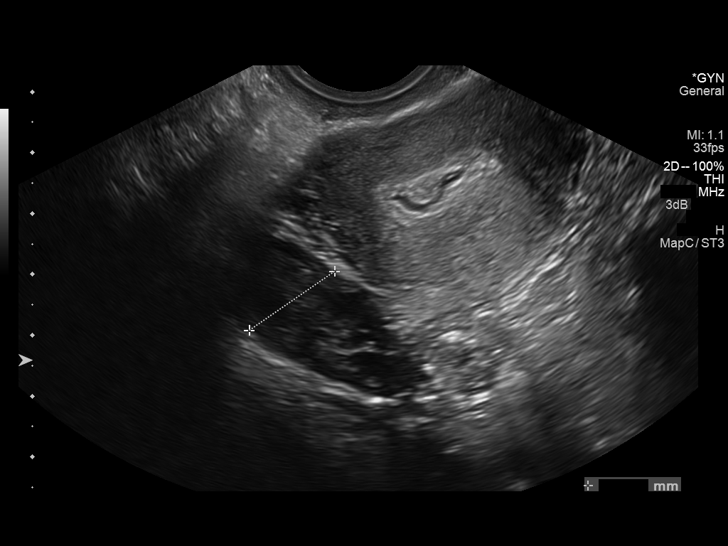
[im 41/66]
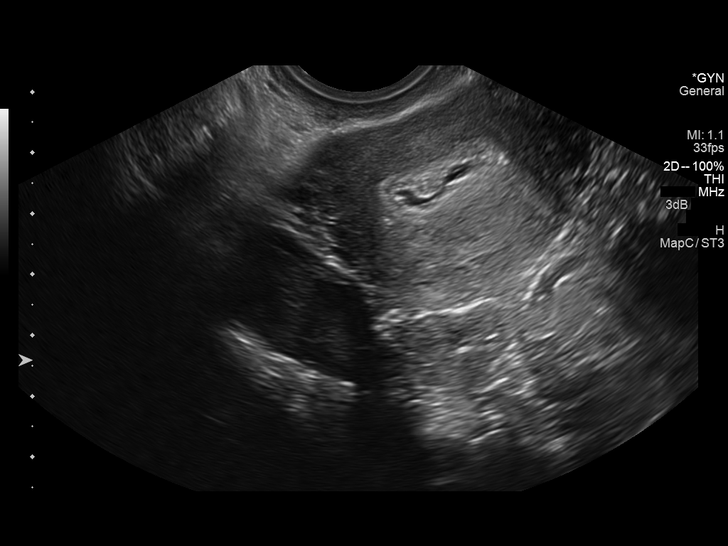
[im 46/66]
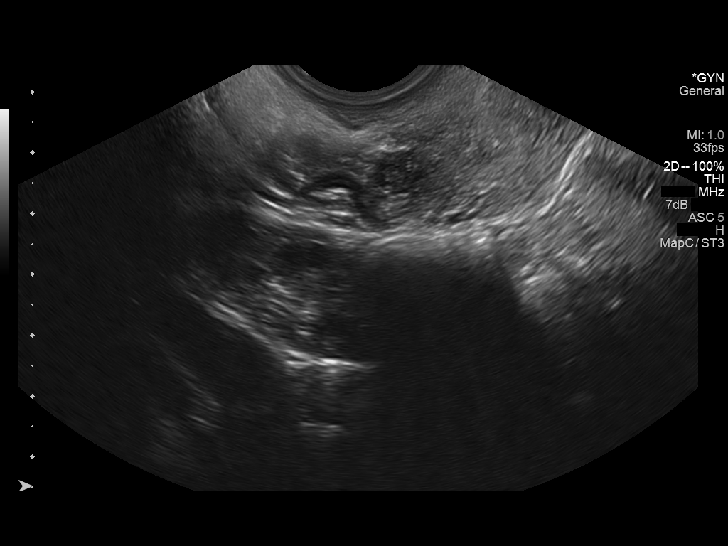
[im 51/66]
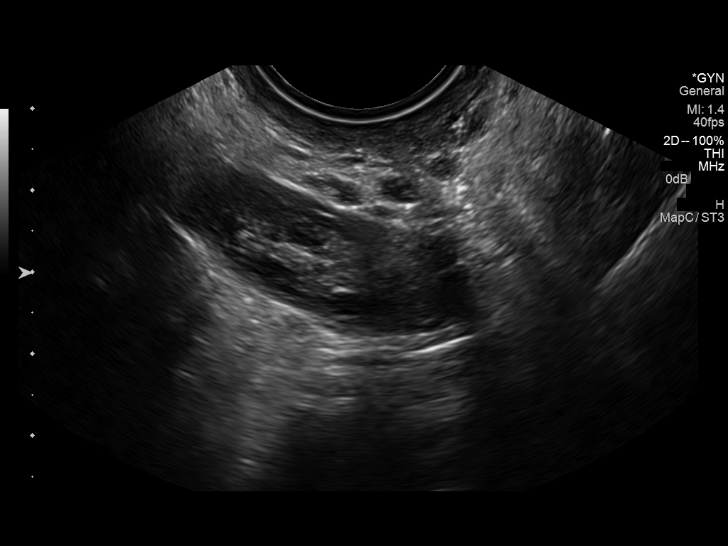
[im 56/66]
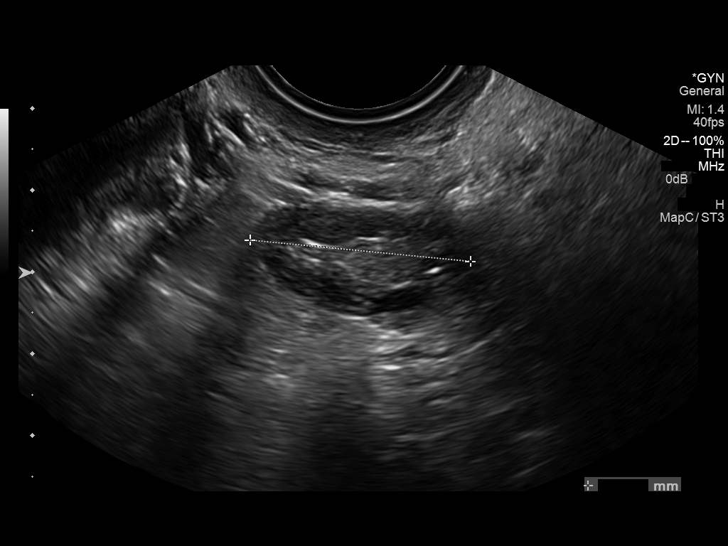
[im 61/66]
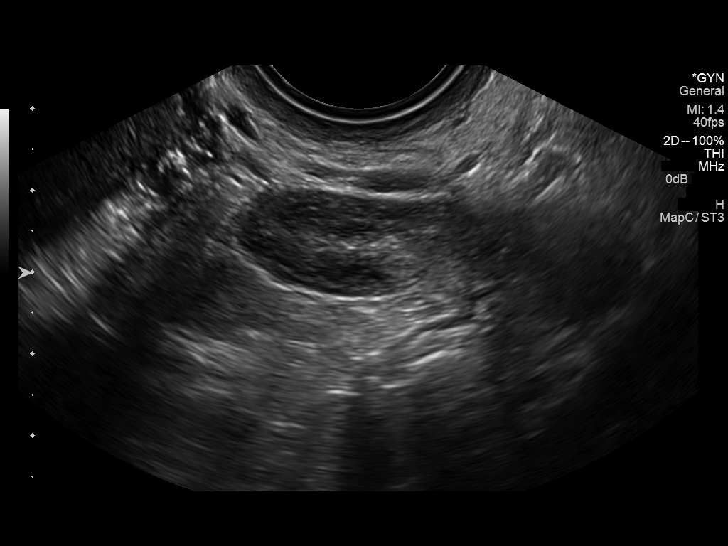
[im 66/66]
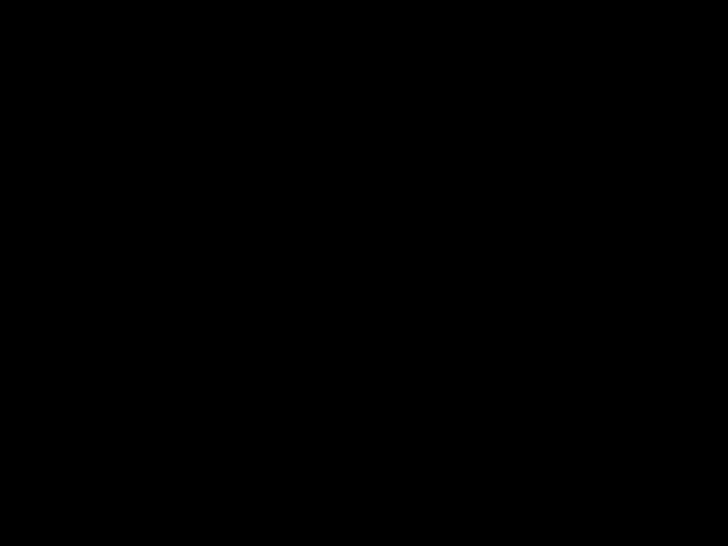

[14 of 28 positions shown; findings below may reference images not displayed]

FINDINGS: Intrauterine gestational sac: Not identified

Yolk sac:  Not identified

Embryo:  Not identified

Cardiac Activity: Not identified

Maternal uterus/adnexae: There is elongated fluid collection in.
lower uterine segment. No gestational sac is demonstrated in the
endometrial canal. No adnexal masses present. Normal ovaries. No
free fluid
IMPRESSION: 1. No intrauterine gestational sac.
2. Elongated fluid collection lower uterine segment suspicious for
spontaneous abortion in progress.
3. No adnexal mass.
4. No free fluid.

## 2016-04-27 ENCOUNTER — Other Ambulatory Visit: Payer: Self-pay | Admitting: Obstetrics and Gynecology

## 2016-04-27 DIAGNOSIS — O3680X0 Pregnancy with inconclusive fetal viability, not applicable or unspecified: Secondary | ICD-10-CM

## 2016-04-30 ENCOUNTER — Ambulatory Visit (INDEPENDENT_AMBULATORY_CARE_PROVIDER_SITE_OTHER): Payer: BLUE CROSS/BLUE SHIELD

## 2016-04-30 DIAGNOSIS — O3680X0 Pregnancy with inconclusive fetal viability, not applicable or unspecified: Secondary | ICD-10-CM | POA: Diagnosis not present

## 2016-04-30 DIAGNOSIS — Z3A01 Less than 8 weeks gestation of pregnancy: Secondary | ICD-10-CM | POA: Diagnosis not present

## 2016-04-30 NOTE — Progress Notes (Signed)
US 5+4 wks,single IUP w/ys,pos fht 122 bpm,normal ov's bilat,crl 2.5 mm

## 2016-05-11 ENCOUNTER — Encounter: Payer: Self-pay | Admitting: Women's Health

## 2016-05-11 ENCOUNTER — Ambulatory Visit (INDEPENDENT_AMBULATORY_CARE_PROVIDER_SITE_OTHER): Payer: BLUE CROSS/BLUE SHIELD | Admitting: Women's Health

## 2016-05-11 VITALS — BP 116/76 | HR 60 | Wt 181.0 lb

## 2016-05-11 DIAGNOSIS — O0991 Supervision of high risk pregnancy, unspecified, first trimester: Secondary | ICD-10-CM | POA: Diagnosis not present

## 2016-05-11 DIAGNOSIS — O210 Mild hyperemesis gravidarum: Secondary | ICD-10-CM

## 2016-05-11 DIAGNOSIS — O0990 Supervision of high risk pregnancy, unspecified, unspecified trimester: Secondary | ICD-10-CM | POA: Insufficient documentation

## 2016-05-11 DIAGNOSIS — Z6791 Unspecified blood type, Rh negative: Secondary | ICD-10-CM | POA: Insufficient documentation

## 2016-05-11 DIAGNOSIS — Z23 Encounter for immunization: Secondary | ICD-10-CM | POA: Diagnosis not present

## 2016-05-11 DIAGNOSIS — Q512 Other doubling of uterus, unspecified: Secondary | ICD-10-CM

## 2016-05-11 DIAGNOSIS — O26891 Other specified pregnancy related conditions, first trimester: Secondary | ICD-10-CM | POA: Diagnosis not present

## 2016-05-11 DIAGNOSIS — Z3A08 8 weeks gestation of pregnancy: Secondary | ICD-10-CM

## 2016-05-11 DIAGNOSIS — Z331 Pregnant state, incidental: Secondary | ICD-10-CM

## 2016-05-11 DIAGNOSIS — O3401 Maternal care for unspecified congenital malformation of uterus, first trimester: Secondary | ICD-10-CM | POA: Diagnosis not present

## 2016-05-11 DIAGNOSIS — Z0283 Encounter for blood-alcohol and blood-drug test: Secondary | ICD-10-CM

## 2016-05-11 DIAGNOSIS — Z3682 Encounter for antenatal screening for nuchal translucency: Secondary | ICD-10-CM

## 2016-05-11 DIAGNOSIS — N898 Other specified noninflammatory disorders of vagina: Secondary | ICD-10-CM

## 2016-05-11 DIAGNOSIS — Z3481 Encounter for supervision of other normal pregnancy, first trimester: Secondary | ICD-10-CM

## 2016-05-11 DIAGNOSIS — Z1389 Encounter for screening for other disorder: Secondary | ICD-10-CM

## 2016-05-11 DIAGNOSIS — O360111 Maternal care for anti-D [Rh] antibodies, first trimester, fetus 1: Secondary | ICD-10-CM | POA: Diagnosis not present

## 2016-05-11 DIAGNOSIS — O3400 Maternal care for unspecified congenital malformation of uterus, unspecified trimester: Secondary | ICD-10-CM | POA: Insufficient documentation

## 2016-05-11 DIAGNOSIS — O26899 Other specified pregnancy related conditions, unspecified trimester: Secondary | ICD-10-CM

## 2016-05-11 LAB — POCT WET PREP (WET MOUNT)
CLUE CELLS WET PREP WHIFF POC: NEGATIVE
TRICHOMONAS WET PREP HPF POC: ABSENT

## 2016-05-11 MED ORDER — DOXYLAMINE-PYRIDOXINE 10-10 MG PO TBEC: DELAYED_RELEASE_TABLET | ORAL | 6 refills | Status: DC

## 2016-05-11 NOTE — Patient Instructions (Signed)

## 2016-05-11 NOTE — Progress Notes (Signed)
Subjective:  Joann Marshall is a 25 y.o. G80P1011 Caucasian female at [redacted]w[redacted]d by 5wk u/s, being seen today for her first obstetrical visit.  Her obstetrical history is significant for sab x 1, then 37wk SVB after IOL for cholestasis dx 2nd trimester, partial septate uterus, Rh neg.  Pregnancy history fully reviewed.  Patient reports n/v- requests meds- is applying for preg Mc[facility] but not active yet. Increased yellow d/c w/ 'slight' odor, no itching/irritation. Denies vb, cramping, uti s/s.  BP 116/76   Pulse 60   Wt 181 lb (82.1 kg)   LMP 03/02/2016 (Exact Date)   BMI 26.73 kg/m   HISTORY: OB History  Gravida Para Term Preterm AB Living  3 1 1   1 1   SAB TAB Ectopic Multiple Live Births  1     0 1    # Outcome Date GA Lbr Len/2nd Weight Sex Delivery Anes PTL Lv  3 Current           2 Term 07/25/15 [redacted]w[redacted]d 05:48 / 00:27 6 lb 3.1 oz (2.81 kg) F Vag-Spont EPI, Local N LIV     Birth Comments: none  1 SAB 2015 [redacted]w[redacted]d            Birth Comments: System Generated. Please review and update pregnancy details.     Past Medical History:  Diagnosis Date  . 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 Headache   . Hx of varicella   . Partial septate uterus   . Renal disorder   . UTI (urinary tract infection)    Past Surgical History:  Procedure Laterality Date  . NO PAST SURGERIES     Family History  Problem Relation Age of Onset  . Heart disease Paternal Grandfather   . Other Maternal Grandfather     Heart surgery  . Alcohol abuse Maternal Grandfather     Exam   System:     General: Well developed & nourished, no acute distress   Skin: Warm & dry, normal coloration and turgor, no rashes   Neurologic: Alert & oriented, normal mood   Cardiovascular: Regular rate & rhythm   Respiratory: Effort & rate normal, LCTAB, acyanotic   Abdomen: Soft, non tender   Extremities: normal strength, tone   Pelvic Exam:    Perineum: Normal perineum   Vulva: Normal, no lesions   Vagina:   Normal mucosa, normal discharge, wet prep neg   Cervix: Normal, bulbous, appears closed   Uterus: Normal size/shape/contour for GA   . Thin prep pap smear neg 07/2014 @ [facility]: will work in w/ Hospital doctor for [employer] for orders placed or performed in visit on 05/11/16 (from the past 24 hour(s))  POCT Wet Prep [name])     Status: Normal   Collection Time: 05/11/16  9:26 AM  Result Value Ref Range   Source Wet Prep POC vaginal    WBC, Wet Prep HPF POC few    Bacteria Wet Prep HPF POC None None, Few, Too numerous to count   BACTERIA WET PREP MORPHOLOGY POC     Clue Cells Wet Prep HPF POC None None, Too numerous to count   Clue Cells Wet Prep Whiff POC Negative Whiff    Yeast Wet Prep HPF POC None    KOH Wet Prep POC     Trichomonas Wet Prep HPF POC Absent Absent      Assessment:   Pregnancy: G3P1011 Patient Active Problem List   Diagnosis Date  Noted  . Supervision of normal pregnancy 05/11/2016  . 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  Plan:  Initial labs drawn Continue prenatal vitamins Problem list reviewed and updated Reviewed n/v relief measures and warning s/s to report Rx diclegis, if denied by insurance can rx phenergan or do otc vit b6/doxylamine Reviewed recommended weight gain based on pre-gravid BMI Encouraged well-balanced diet Genetic Screening discussed Integrated Screen: requested Cystic fibrosis screening discussed declined Ultrasound discussed; fetal survey: requested Follow up in 5 weeks for 1st IT/NT and visit, then 4wks for 2nd IT and visit (pt wanted to go ahead and schedule both today d/t work) [employer] completed Flu shot today  Marge DuncansBooker, Kimberly Randall CNM, [facility] 05/11/2016 9:23 AM

## 2016-05-12 LAB — VARICELLA ZOSTER ANTIBODY, IGG: Varicella zoster IgG: 330 index (ref >165)

## 2016-05-12 LAB — PMP SCREEN PROFILE (10S), URINE
Amphetamine Screen, Ur: NEGATIVE ng/mL
BARBITURATE SCRN UR: NEGATIVE ng/mL
BENZODIAZEPINE SCREEN, URINE: NEGATIVE ng/mL
Cannabinoids Ur Ql Scn: POSITIVE ng/mL
Cocaine(Metab.)Screen, Urine: NEGATIVE ng/mL
Creatinine(Crt), U: 276 mg/dL (ref 20.0–300.0)
METHADONE SCREEN, URINE: NEGATIVE ng/mL
Opiate Scrn, Ur: NEGATIVE ng/mL
Oxycodone+Oxymorphone Ur Ql Scn: NEGATIVE ng/mL
PCP Scrn, Ur: NEGATIVE ng/mL
PH UR, DRUG SCRN: 5.6 (ref 4.5–8.9)
Propoxyphene, Screen: NEGATIVE ng/mL

## 2016-05-12 LAB — CBC
HEMATOCRIT: 37.7 % (ref 34.0–46.6)
Hemoglobin: 12.8 g/dL (ref 11.1–15.9)
MCH: 25.9 pg — ABNORMAL LOW (ref 26.6–33.0)
MCHC: 34 g/dL (ref 31.5–35.7)
MCV: 76 fL — AB (ref 79–97)
PLATELETS: 394 10*3/uL — AB (ref 150–379)
RBC: 4.94 x10E6/uL (ref 3.77–5.28)
RDW: 15.1 % (ref 12.3–15.4)
WBC: 12.5 10*3/uL — AB (ref 3.4–10.8)

## 2016-05-12 LAB — RUBELLA SCREEN: Rubella Antibodies, IGG: 1.04 index (ref >0.99)

## 2016-05-12 LAB — HEPATITIS B SURFACE ANTIGEN: HEP B S AG: NEGATIVE

## 2016-05-12 LAB — URINALYSIS, ROUTINE W REFLEX MICROSCOPIC
BILIRUBIN UA: NEGATIVE
GLUCOSE, UA: NEGATIVE
KETONES UA: NEGATIVE
NITRITE UA: NEGATIVE
Protein, UA: NEGATIVE
RBC UA: NEGATIVE
UUROB: 0.2 mg/dL (ref 0.2–1.0)
pH, UA: 6 (ref 5.0–7.5)

## 2016-05-12 LAB — MICROSCOPIC EXAMINATION
Casts: NONE SEEN /lpf
RBC MICROSCOPIC, UA: NONE SEEN /HPF (ref 0–?)

## 2016-05-12 LAB — ANTIBODY SCREEN: ANTIBODY SCREEN: NEGATIVE

## 2016-05-12 LAB — HIV ANTIBODY (ROUTINE TESTING W REFLEX): HIV SCREEN 4TH GENERATION: NONREACTIVE

## 2016-05-12 LAB — ABO/RH: RH TYPE: NEGATIVE

## 2016-05-12 LAB — RPR: RPR Ser Ql: NONREACTIVE

## 2016-05-13 LAB — GC/CHLAMYDIA PROBE AMP
Chlamydia trachomatis, NAA: NEGATIVE
Neisseria gonorrhoeae by PCR: NEGATIVE

## 2016-05-13 LAB — URINE CULTURE: ORGANISM ID, BACTERIA: NO GROWTH

## 2016-05-14 ENCOUNTER — Encounter: Payer: Self-pay | Admitting: Women's Health

## 2016-05-14 DIAGNOSIS — F1290 Cannabis use, unspecified, uncomplicated: Secondary | ICD-10-CM | POA: Insufficient documentation

## 2016-06-14 ENCOUNTER — Ambulatory Visit (INDEPENDENT_AMBULATORY_CARE_PROVIDER_SITE_OTHER): Payer: Medicaid Other

## 2016-06-14 ENCOUNTER — Ambulatory Visit (INDEPENDENT_AMBULATORY_CARE_PROVIDER_SITE_OTHER): Payer: Medicaid Other | Admitting: Advanced Practice Midwife

## 2016-06-14 ENCOUNTER — Encounter: Payer: Self-pay | Admitting: Advanced Practice Midwife

## 2016-06-14 VITALS — BP 124/78 | HR 80 | Wt 179.0 lb

## 2016-06-14 DIAGNOSIS — O3401 Maternal care for unspecified congenital malformation of uterus, first trimester: Secondary | ICD-10-CM

## 2016-06-14 DIAGNOSIS — Z3481 Encounter for supervision of other normal pregnancy, first trimester: Secondary | ICD-10-CM

## 2016-06-14 DIAGNOSIS — Z1389 Encounter for screening for other disorder: Secondary | ICD-10-CM | POA: Diagnosis not present

## 2016-06-14 DIAGNOSIS — Z3A12 12 weeks gestation of pregnancy: Secondary | ICD-10-CM | POA: Diagnosis not present

## 2016-06-14 DIAGNOSIS — O0991 Supervision of high risk pregnancy, unspecified, first trimester: Secondary | ICD-10-CM

## 2016-06-14 DIAGNOSIS — Z3682 Encounter for antenatal screening for nuchal translucency: Secondary | ICD-10-CM

## 2016-06-14 DIAGNOSIS — O360111 Maternal care for anti-D [Rh] antibodies, first trimester, fetus 1: Secondary | ICD-10-CM

## 2016-06-14 DIAGNOSIS — Z331 Pregnant state, incidental: Secondary | ICD-10-CM

## 2016-06-14 LAB — POCT URINALYSIS DIPSTICK
GLUCOSE UA: NEGATIVE
Ketones, UA: NEGATIVE
NITRITE UA: NEGATIVE
Protein, UA: NEGATIVE
RBC UA: NEGATIVE

## 2016-06-14 NOTE — Progress Notes (Signed)
US 12 wks,measurements c/w dates,normal ov's bialt,NB present,NT 2 mm,fhr 158 bpm, 61.7 mm,ant pl gr 0

## 2016-06-14 NOTE — Progress Notes (Signed)
[id number] 632w0d Estimated Date of Delivery: 12/27/16  Blood pressure 124/78, pulse 80, weight 179 lb (81.2 kg), last menstrual period 03/02/2016, not currently breastfeeding.   BP weight and urine results all reviewed and noted.  Please refer to the obstetrical flow sheet for the fundal height and fetal heart rate documentation:  Patient denies any bleeding and no rupture of membranes symptoms or regular contractions.  US 12 wks,measurements c/w dates,normal ov's bialt,NB present,NT 2 mm,fhr 158 bpm, 61.7 mm,ant pl gr 0  Patient is without complaints. Feels well All questions were answered.  Orders Placed This Encounter  Procedures  . Maternal Screen, Integrated #1  . POCT urinalysis dipstick    Plan:  Continued routine obstetrical care,   Return in about 4 weeks (around 07/12/2016) for LROB, 2nd IT.

## 2016-06-16 LAB — MATERNAL SCREEN, INTEGRATED #1
CROWN RUMP LENGTH MAT SCREEN: 61.7 mm
GEST. AGE ON COLLECTION DATE: 12.6 wk
Maternal Age at EDD: 26.7 years
Nuchal Translucency (NT): 2 mm
Number of Fetuses: 1
PAPP-A Value: 303.2 ng/mL
WEIGHT: 179 [lb_av]

## 2016-06-28 ENCOUNTER — Telehealth: Payer: Self-pay | Admitting: Obstetrics & Gynecology

## 2016-06-28 NOTE — Telephone Encounter (Signed)
Pt states she is having itching all over her body, has had Cholestasis with last pregnancy. Pt offered an appt for tomorrow for lab work (bile acids, cmp per Joellyn HaffKim Booker) pt states she cannot come tomorrow due to work. Pt given an appt for labs for 07/02/2016 and pt to keep her f/u appt with Joellyn HaffKim Booker, CNM 07/13/2016.

## 2016-07-02 ENCOUNTER — Other Ambulatory Visit: Payer: Medicaid Other

## 2016-07-02 ENCOUNTER — Telehealth: Payer: Self-pay | Admitting: *Deleted

## 2016-07-02 DIAGNOSIS — K831 Obstruction of bile duct: Secondary | ICD-10-CM

## 2016-07-02 DIAGNOSIS — O26612 Liver and biliary tract disorders in pregnancy, second trimester: Principal | ICD-10-CM

## 2016-07-02 NOTE — Telephone Encounter (Signed)
Pt requesting lab results for bile acids. Pt informed lab results still pending will call pt when get results.

## 2016-07-03 ENCOUNTER — Other Ambulatory Visit: Payer: Self-pay | Admitting: Advanced Practice Midwife

## 2016-07-03 LAB — BILE ACIDS, TOTAL: BILE ACIDS TOTAL: 38 umol/L — AB (ref 4.7–24.5)

## 2016-07-03 MED ORDER — URSODIOL 250 MG PO TABS: 250.0000 mg | ORAL_TABLET | Freq: Three times a day (TID) | ORAL | 6 refills | Status: DC

## 2016-07-03 NOTE — Telephone Encounter (Signed)
Pt informed her bile acids results of 38.0. Pt c/o itching all over, hx of Cholestasis in previous pregnancy.

## 2016-07-03 NOTE — Progress Notes (Signed)
ursidiol 250 tid

## 2016-07-05 ENCOUNTER — Telehealth: Payer: Self-pay | Admitting: Women's Health

## 2016-07-05 DIAGNOSIS — K831 Obstruction of bile duct: Secondary | ICD-10-CM

## 2016-07-05 DIAGNOSIS — O26619 Liver and biliary tract disorders in pregnancy, unspecified trimester: Principal | ICD-10-CM

## 2016-07-05 DIAGNOSIS — O26612 Liver and biliary tract disorders in pregnancy, second trimester: Principal | ICD-10-CM

## 2016-07-05 DIAGNOSIS — Z8719 Personal history of other diseases of the digestive system: Secondary | ICD-10-CM | POA: Insufficient documentation

## 2016-07-05 DIAGNOSIS — Z8759 Personal history of other complications of pregnancy, childbirth and the puerperium: Secondary | ICD-10-CM

## 2016-07-05 NOTE — Telephone Encounter (Signed)
Pt returned call, notified her we need cmp and hep c, unable to add to bile acids that were drawn 07/02/16, will come in am.  Cheral MarkerKimberly R. Sheretha Shadd, CNM, [facility]NP-BC 07/05/2016 4:35 PM

## 2016-07-05 NOTE — Telephone Encounter (Signed)
LM for pt to return call, needs to come in for CMP and HepC.  Cheral MarkerKimberly R. Georgeana Oertel, CNM, [facility] 07/05/2016 3:12 PM

## 2016-07-07 LAB — COMPREHENSIVE METABOLIC PANEL
A/G RATIO: 1.4 (ref 1.2–2.2)
ALK PHOS: 228 IU/L — AB (ref 39–117)
ALT: 302 IU/L — AB (ref 0–32)
AST: 143 IU/L — AB (ref 0–40)
Albumin: 3.8 g/dL (ref 3.5–5.5)
BUN/Creatinine Ratio: 14 (ref 9–23)
BUN: 7 mg/dL (ref 6–20)
Bilirubin Total: 0.8 mg/dL (ref 0.0–1.2)
CALCIUM: 9.4 mg/dL (ref 8.7–10.2)
CO2: 23 mmol/L (ref 18–29)
CREATININE: 0.49 mg/dL — AB (ref 0.57–1.00)
Chloride: 97 mmol/L (ref 96–106)
GFR calc Af Amer: 156 mL/min/{1.73_m2} (ref >59)
GFR calc non Af Amer: 135 mL/min/{1.73_m2} (ref >59)
GLOBULIN, TOTAL: 2.7 g/dL (ref 1.5–4.5)
Glucose: 86 mg/dL (ref 65–99)
POTASSIUM: 4 mmol/L (ref 3.5–5.2)
SODIUM: 135 mmol/L (ref 134–144)
Total Protein: 6.5 g/dL (ref 6.0–8.5)

## 2016-07-07 LAB — HEPATITIS C ANTIBODY

## 2016-07-13 ENCOUNTER — Ambulatory Visit (INDEPENDENT_AMBULATORY_CARE_PROVIDER_SITE_OTHER): Payer: Medicaid Other | Admitting: Women's Health

## 2016-07-13 ENCOUNTER — Encounter: Payer: Self-pay | Admitting: Women's Health

## 2016-07-13 ENCOUNTER — Other Ambulatory Visit: Payer: Self-pay | Admitting: *Deleted

## 2016-07-13 VITALS — BP 112/72 | HR 76 | Wt 175.0 lb

## 2016-07-13 DIAGNOSIS — Z1389 Encounter for screening for other disorder: Secondary | ICD-10-CM | POA: Diagnosis not present

## 2016-07-13 DIAGNOSIS — O26612 Liver and biliary tract disorders in pregnancy, second trimester: Secondary | ICD-10-CM

## 2016-07-13 DIAGNOSIS — Z331 Pregnant state, incidental: Secondary | ICD-10-CM | POA: Diagnosis not present

## 2016-07-13 DIAGNOSIS — Z3682 Encounter for antenatal screening for nuchal translucency: Secondary | ICD-10-CM

## 2016-07-13 DIAGNOSIS — K831 Obstruction of bile duct: Secondary | ICD-10-CM

## 2016-07-13 DIAGNOSIS — O0992 Supervision of high risk pregnancy, unspecified, second trimester: Secondary | ICD-10-CM | POA: Diagnosis not present

## 2016-07-13 DIAGNOSIS — N898 Other specified noninflammatory disorders of vagina: Secondary | ICD-10-CM

## 2016-07-13 DIAGNOSIS — Z3A16 16 weeks gestation of pregnancy: Secondary | ICD-10-CM | POA: Diagnosis not present

## 2016-07-13 DIAGNOSIS — O26619 Liver and biliary tract disorders in pregnancy, unspecified trimester: Secondary | ICD-10-CM

## 2016-07-13 LAB — POCT URINALYSIS DIPSTICK
Blood, UA: NEGATIVE
Glucose, UA: NEGATIVE
KETONES UA: NEGATIVE
Leukocytes, UA: NEGATIVE
Nitrite, UA: NEGATIVE
PROTEIN UA: NEGATIVE

## 2016-07-13 LAB — POCT WET PREP (WET MOUNT)
Clue Cells Wet Prep Whiff POC: NEGATIVE
Trichomonas Wet Prep HPF POC: ABSENT

## 2016-07-13 MED ORDER — URSODIOL 300 MG PO CAPS: 600.0000 mg | ORAL_CAPSULE | Freq: Three times a day (TID) | ORAL | 3 refills | Status: DC

## 2016-07-13 NOTE — Telephone Encounter (Signed)
FYI Called in verbal order for Ursodiol 600mg  TID tablets. I was unable to find the order for the tablets.

## 2016-07-13 NOTE — Progress Notes (Signed)
High Risk Pregnancy Diagnosis(es): ICP dx @ 14wks G3P1011 56w1dEstimated Date of Delivery: 12/27/16 BP 112/72   Pulse 76   Wt 175 lb (79.4 kg)   LMP 03/02/2016 (Exact Date)   BMI 25.84 kg/m   Urinalysis: Negative HPI:  Itching is still very bad, all over, has sores/scabs from scratching. Taking 2574mTID actigall x 10 days, not helping. LFTs were elevated last check 143/302, Hep C was neg, bile acids were 38. Has not been fasting today BP, weight, and urine reviewed.  No fm yet. Denies cramping, lof, vb, uti s/s. Reports vaginal odor.   Fundal Height:  16wks Fetal Heart rate:  150 Edema:  None Spec exam: cx closed, normal non-odorous d/c, wet prep neg Scabs/sores all over body from scratching  Reviewed dx of ICP, had w/ lst pregnancy- dx ~20wks then. Discussed warning s/s to report. Cool showers, cool wash cloths, benadryl prn All questions were answered Assessment: 1660w1dP dx @ 14wks Medication(s) Plans:  Increase actigall to 600m62mD Treatment Plan:  Return Monday for fasting bile acids, cmp, and 2nd IT; U/S @ dx then q 3wks    Weekly BPP @ 28wks then 2x/wk testing @ 32wks   Deliver PRN or 37wks Follow up on Monday for fasting labs (no visit), then 2wks for high-risk OB appt

## 2016-07-13 NOTE — Patient Instructions (Signed)
Cholestasis of Pregnancy Cholestasis refers to any condition that causes the flow of the digestive fluid (bile) produced by your liver to slow or stop. Cholestasis of pregnancy is most common toward the end of pregnancy (thirdtrimester), but it can occur any time during your pregnancy. The condition often goes away soon after your baby is born.  Cholestasis may be uncomfortable but is usually harmless to you. However, it can be harmful to your baby. Cholestasis may increase the risk that your baby will be born too early (preterm delivery).  CAUSES  The cause of cholestasis of pregnancy is not known. Pregnancy hormones may affect the way your gallbladder functions. Your gallbladder normally holds the bile from your liver until you need it to help digest fat in your diet. Pregnancy hormones may cause the flow of bile to slow down and back up into your liver. Bile may then get into your bloodstream and cause cholestasis symptoms. RISK FACTORS You may be at increased risk if:  You had cholestasis during a previous pregnancy.  You have a family history of cholestasis.  You have liver problems.  You are having twins. SIGNS AND SYMPTOMS  The most common symptom of cholestasis of pregnancy is intense itching, especially on the palms of your hands and soles of your feet. The itching can spread to the rest of your body and is often worse at night. You will not usually have a rash. Other symptoms may include:   Feeling tired.   Yellowish discoloration of your skin and the whites of your eyes (jaundice).   Dark-colored urine.   Light-colored stools.  Poor appetite.  DIAGNOSIS  Your health care provider will take your medical history and do a physical exam. You may have blood tests to check your liver function, bile level, and bilirubin level.  TREATMENT  Treatment is meant to make you more comfortable and keep your baby safe. Your health care provider may prescribe medicine to relieve your  itching. The medicine used may also improve your blood test results and help keep your baby safe. Your health care provider may also give you vitamin K before delivery to prevent excessive bleeding.  Your health care provider may want to check your baby (fetal monitoring) frequently, as often as every 2 weeks. Once your baby's lungs have developed enough, your health care provider may recommend starting (inducing) your labor and delivery by week 37 of your pregnancy. HOME CARE INSTRUCTIONS   Only use anti-itch creams and take medicines as directed by your health care provider.  Take cool baths to soothe your itching.   Keep your fingernails short to prevent skin irritation from scratching.   Keep all your appointments for fetal monitoring. SEEK MEDICAL CARE IF:  Your symptoms get worse, even with treatment. SEEK IMMEDIATE MEDICAL CARE IF: You go into early labor at home. MAKE SURE YOU:  Understand these instructions.  Will watch your condition.  Will get help right away if you are not doing well or get worse. This information is not intended to replace advice given to you by your health care provider. Make sure you discuss any questions you have with your health care provider. Document Released: 07/13/2000 Document Revised: 08/06/2014 Document Reviewed: 05/08/2013 Elsevier Interactive Patient Education  2017 [employer].  Second Trimester of Pregnancy The second trimester is from week 13 through week 28 (months 4 through 6). The second trimester is often a time when you feel your best. Your body has also adjusted to being pregnant, and  you begin to feel better physically. Usually, morning sickness has lessened or quit completely, you may have more energy, and you may have an increase in appetite. The second trimester is also a time when the fetus is growing rapidly. At the end of the sixth month, the fetus is about 9 inches long and weighs about 1 pounds. You will likely begin to feel  the baby move (quickening) between 18 and 20 weeks of the pregnancy. Body changes during your second trimester Your body continues to go through many changes during your second trimester. The changes vary from woman to woman.  Your weight will continue to increase. You will notice your lower abdomen bulging out.  You may begin to get stretch marks on your hips, abdomen, and breasts.  You may develop headaches that can be relieved by medicines. The medicines should be approved by your health care provider.  You may urinate more often because the fetus is pressing on your bladder.  You may develop or continue to have heartburn as a result of your pregnancy.  You may develop constipation because certain hormones are causing the muscles that push waste through your intestines to slow down.  You may develop hemorrhoids or swollen, bulging veins (varicose veins).  You may have back pain. This is caused by:  Weight gain.  Pregnancy hormones that are relaxing the joints in your pelvis.  A shift in weight and the muscles that support your balance.  Your breasts will continue to grow and they will continue to become tender.  Your gums may bleed and may be sensitive to brushing and flossing.  Dark spots or blotches (chloasma, mask of pregnancy) may develop on your face. This will likely fade after the baby is born.  A dark line from your belly button to the pubic area (linea nigra) may appear. This will likely fade after the baby is born.  You may have changes in your hair. These can include thickening of your hair, rapid growth, and changes in texture. Some women also have hair loss during or after pregnancy, or hair that feels dry or thin. Your hair will most likely return to normal after your baby is born. What to expect at prenatal visits During a routine prenatal visit:  You will be weighed to make sure you and the fetus are growing normally.  Your blood pressure will be  taken.  Your abdomen will be measured to track your baby's growth.  The fetal heartbeat will be listened to.  Any test results from the previous visit will be discussed. Your health care provider may ask you:  How you are feeling.  If you are feeling the baby move.  If you have had any abnormal symptoms, such as leaking fluid, bleeding, severe headaches, or abdominal cramping.  If you are using any tobacco products, including cigarettes, chewing tobacco, and electronic cigarettes.  If you have any questions. Other tests that may be performed during your second trimester include:  Blood tests that check for:  Low iron levels (anemia).  Gestational diabetes (between 24 and 28 weeks).  Rh antibodies. This is to check for a protein on red blood cells (Rh factor).  Urine tests to check for infections, diabetes, or protein in the urine.  An ultrasound to confirm the proper growth and development of the baby.  An amniocentesis to check for possible genetic problems.  Fetal screens for spina bifida and Down syndrome.  HIV (human immunodeficiency virus) testing. Routine prenatal testing includes screening  for HIV, unless you choose not to have this test. Follow these instructions at home: Eating and drinking  Continue to eat regular, healthy meals.  Avoid raw meat, uncooked cheese, cat litter boxes, and soil used by cats. These carry germs that can cause birth defects in the baby.  Take your prenatal vitamins.  Take 1500-2000 mg of calcium daily starting at the 20th week of pregnancy until you deliver your baby.  If you develop constipation:  Take over-the-counter or prescription medicines.  Drink enough fluid to keep your urine clear or pale yellow.  Eat foods that are high in fiber, such as fresh fruits and vegetables, whole grains, and beans.  Limit foods that are high in fat and processed sugars, such as fried and sweet foods. Activity  Exercise only as directed  by your health care provider. Experiencing uterine cramps is a good sign to stop exercising.  Avoid heavy lifting, wear low heel shoes, and practice good posture.  Wear your seat belt at all times when driving.  Rest with your legs elevated if you have leg cramps or low back pain.  Wear a good support bra for breast tenderness.  Do not use hot tubs, steam rooms, or saunas. Lifestyle  Avoid all smoking, herbs, alcohol, and unprescribed drugs. These chemicals affect the formation and growth of the baby.  Do not use any products that contain nicotine or tobacco, such as cigarettes and e-cigarettes. If you need help quitting, ask your health care provider.  A sexual relationship may be continued unless your health care provider directs you otherwise. General instructions  Follow your health care provider's instructions regarding medicine use. There are medicines that are either safe or unsafe to take during pregnancy.  Take warm sitz baths to soothe any pain or discomfort caused by hemorrhoids. Use hemorrhoid cream if your health care provider approves.  If you develop varicose veins, wear support hose. Elevate your feet for 15 minutes, 3-4 times a day. Limit salt in your diet.  Visit your dentist if you have not gone yet during your pregnancy. Use a soft toothbrush to brush your teeth and be gentle when you floss.  Keep all follow-up prenatal visits as told by your health care provider. This is important. Contact a health care provider if:  You have dizziness.  You have mild pelvic cramps, pelvic pressure, or nagging pain in the abdominal area.  You have persistent nausea, vomiting, or diarrhea.  You have a bad smelling vaginal discharge.  You have pain with urination. Get help right away if:  You have a fever.  You are leaking fluid from your vagina.  You have spotting or bleeding from your vagina.  You have severe abdominal cramping or pain.  You have rapid weight  gain or weight loss.  You have shortness of breath with chest pain.  You notice sudden or extreme swelling of your face, hands, ankles, feet, or legs.  You have not felt your baby move in over an hour.  You have severe headaches that do not go away with medicine.  You have vision changes. Summary  The second trimester is from week 13 through week 28 (months 4 through 6). It is also a time when the fetus is growing rapidly.  Your body goes through many changes during pregnancy. The changes vary from woman to woman.  Avoid all smoking, herbs, alcohol, and unprescribed drugs. These chemicals affect the formation and growth your baby.  Do not use any tobacco products, such  as cigarettes, chewing tobacco, and e-cigarettes. If you need help quitting, ask your health care provider.  Contact your health care provider if you have any questions. Keep all prenatal visits as told by your health care provider. This is important. This information is not intended to replace advice given to you by your health care provider. Make sure you discuss any questions you have with your health care provider. Document Released: 07/10/2001 Document Revised: 12/22/2015 Document Reviewed: 09/16/2012 Elsevier Interactive Patient Education  2017 [employer]

## 2016-07-17 LAB — COMPREHENSIVE METABOLIC PANEL
A/G RATIO: 1.3 (ref 1.2–2.2)
ALT: 96 IU/L — AB (ref 0–32)
AST: 53 IU/L — ABNORMAL HIGH (ref 0–40)
Albumin: 3.8 g/dL (ref 3.5–5.5)
Alkaline Phosphatase: 210 IU/L — ABNORMAL HIGH (ref 39–117)
BILIRUBIN TOTAL: 0.4 mg/dL (ref 0.0–1.2)
BUN/Creatinine Ratio: 19 (ref 9–23)
BUN: 9 mg/dL (ref 6–20)
CHLORIDE: 97 mmol/L (ref 96–106)
CO2: 21 mmol/L (ref 18–29)
Calcium: 9.6 mg/dL (ref 8.7–10.2)
Creatinine, Ser: 0.48 mg/dL — ABNORMAL LOW (ref 0.57–1.00)
GFR calc non Af Amer: 136 mL/min/{1.73_m2} (ref >59)
GFR, EST AFRICAN AMERICAN: 157 mL/min/{1.73_m2} (ref >59)
Globulin, Total: 2.9 g/dL (ref 1.5–4.5)
Glucose: 75 mg/dL (ref 65–99)
POTASSIUM: 4.2 mmol/L (ref 3.5–5.2)
Sodium: 131 mmol/L — ABNORMAL LOW (ref 134–144)
TOTAL PROTEIN: 6.7 g/dL (ref 6.0–8.5)

## 2016-07-17 LAB — BILE ACIDS, TOTAL: Bile Acids Total: 70.3 umol/L — ABNORMAL HIGH (ref 4.7–24.5)

## 2016-07-18 LAB — MATERNAL SCREEN, INTEGRATED #2
AFP MARKER: 29.5 ng/mL
AFP MOM: 0.91
CROWN RUMP LENGTH: 61.7 mm
DIA MoM: 0.84
DIA Value: 132 pg/mL
ESTRIOL UNCONJUGATED: 1.21 ng/mL
GEST. AGE ON COLLECTION DATE: 12.6 wk
GESTATIONAL AGE: 17.1 wk
Maternal Age at EDD: 26.7 years
Nuchal Translucency (NT): 2 mm
Nuchal Translucency MoM: 1.28
Number of Fetuses: 1
PAPP-A MoM: 0.38
PAPP-A Value: 303.2 ng/mL
TEST RESULTS: NEGATIVE
WEIGHT: 175 [lb_av]
Weight: 179 [lb_av]
hCG MoM: 0.84
hCG Value: 20.9 IU/mL
uE3 MoM: 1.21

## 2016-07-30 NOTE — L&D Delivery Note (Signed)
Delivery Note At 1:55 PM a viable female was delivered via Vaginal, Spontaneous Delivery (Presentation ROA).  APGAR: 7, 9; weight pending  .   Placenta status: delivered intact with gentle traction.  Cord: 3 vessels with the following complications: None .  Cord pH: not collected  Anesthesia: None  Episiotomy: None Lacerations: Labial Suture Repair: N/A Est. Blood Loss (mL): 100  Mom to postpartum.  Baby to Couplet care / Skin to Skin.  Lovena NeighboursAbdoulaye Diallo, PGY-1 12/06/2016, 2:28 PM  OB FELLOW DELIVERY ATTESTATION  I was gloved and present for the delivery in its entirety, and I agree with the above resident's note.    Jen MowElizabeth Mumaw, DO OB Fellow 3:46 PM

## 2016-07-31 ENCOUNTER — Encounter: Payer: Medicaid Other | Admitting: Obstetrics & Gynecology

## 2016-08-08 IMAGING — CR DG LUMBAR SPINE 2-3V
3 series · 3 of 3 positions shown · non-contrast
Comparison: None.

CLINICAL DATA: Recurrent low back pain

EXAM:
LUMBAR SPINE - 2-3 VIEW

[view not recorded (1 of 3)]
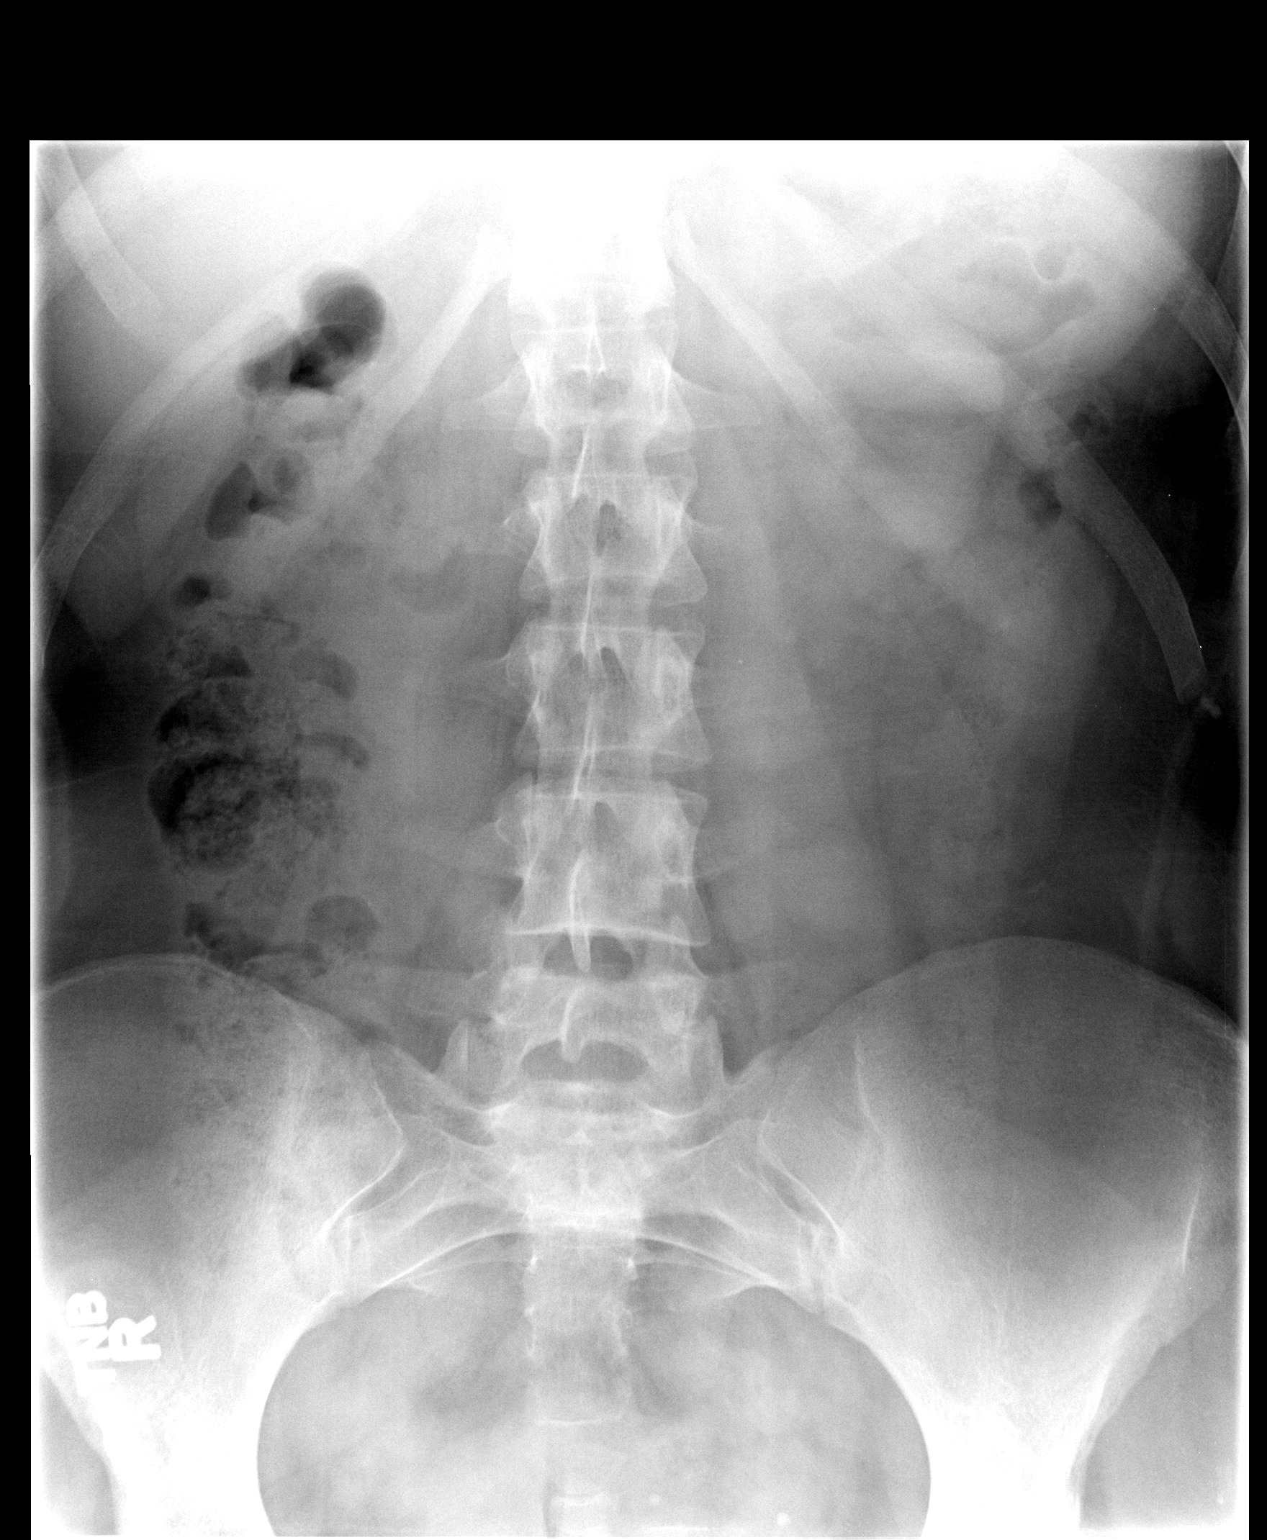

[view not recorded (2 of 3)]
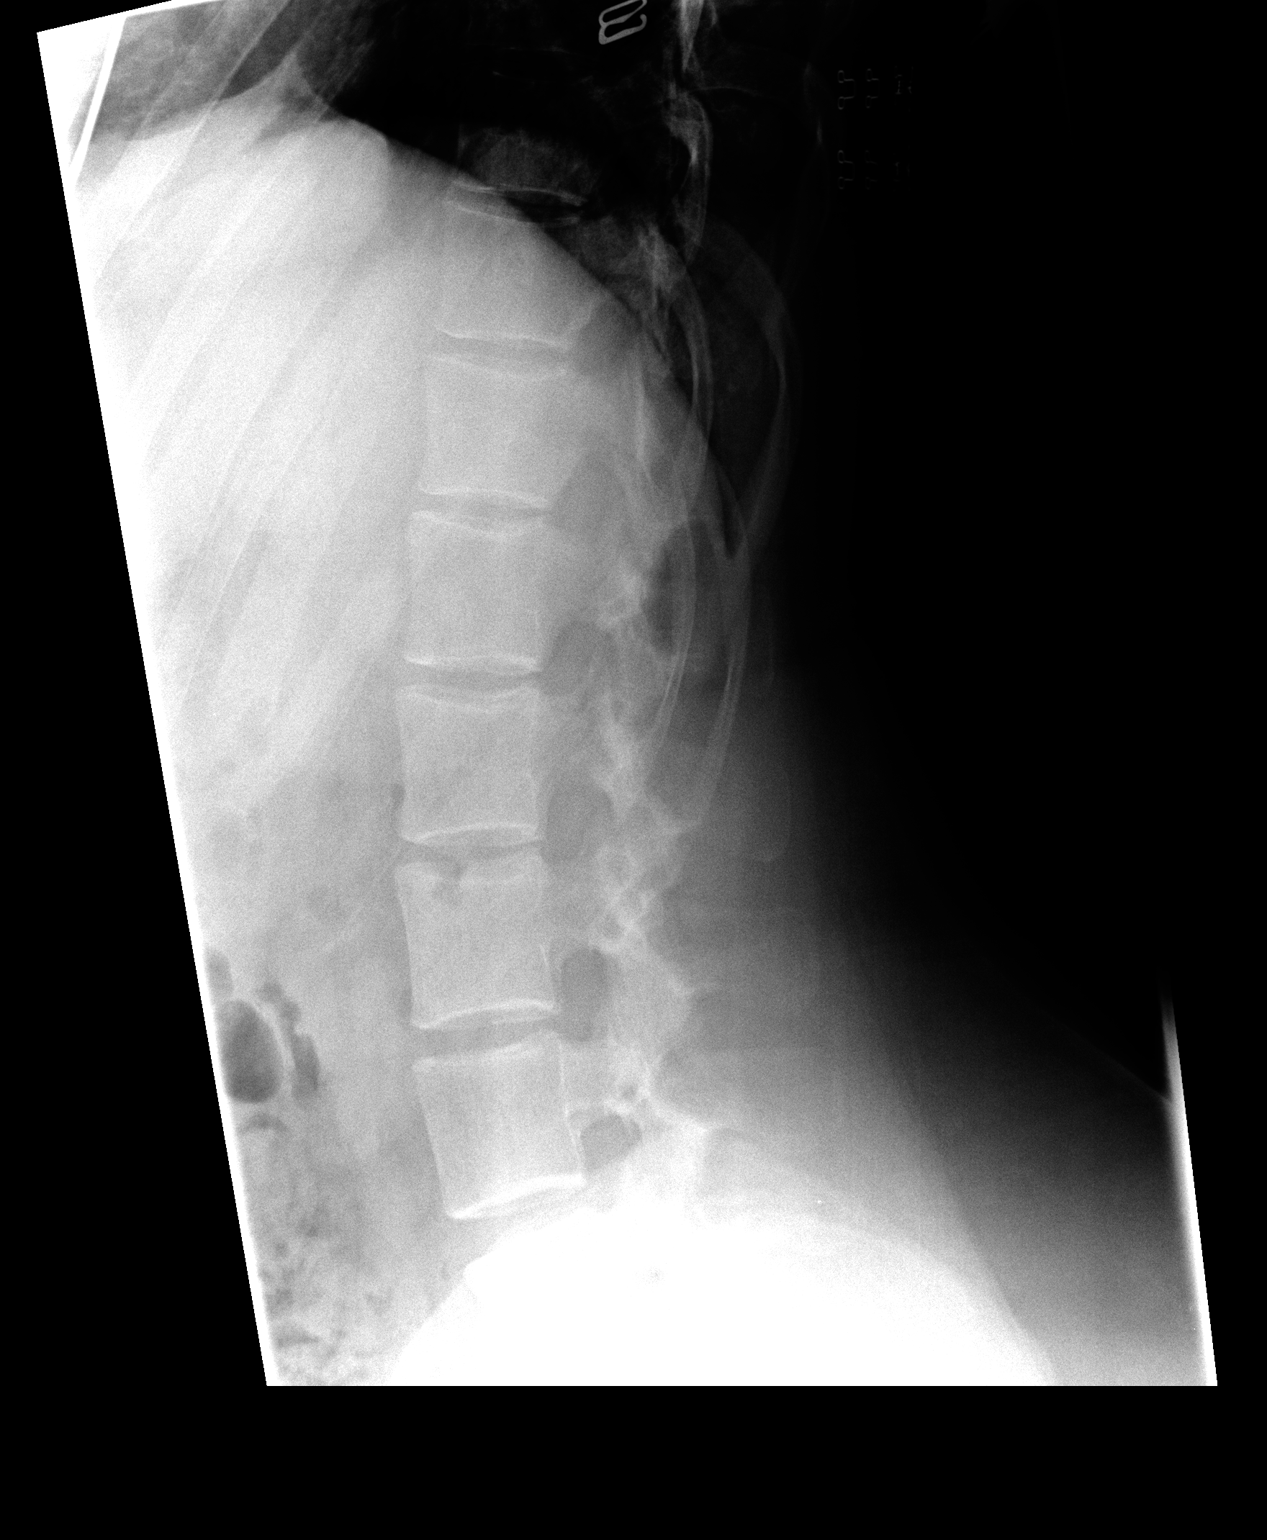

[view not recorded (3 of 3)]
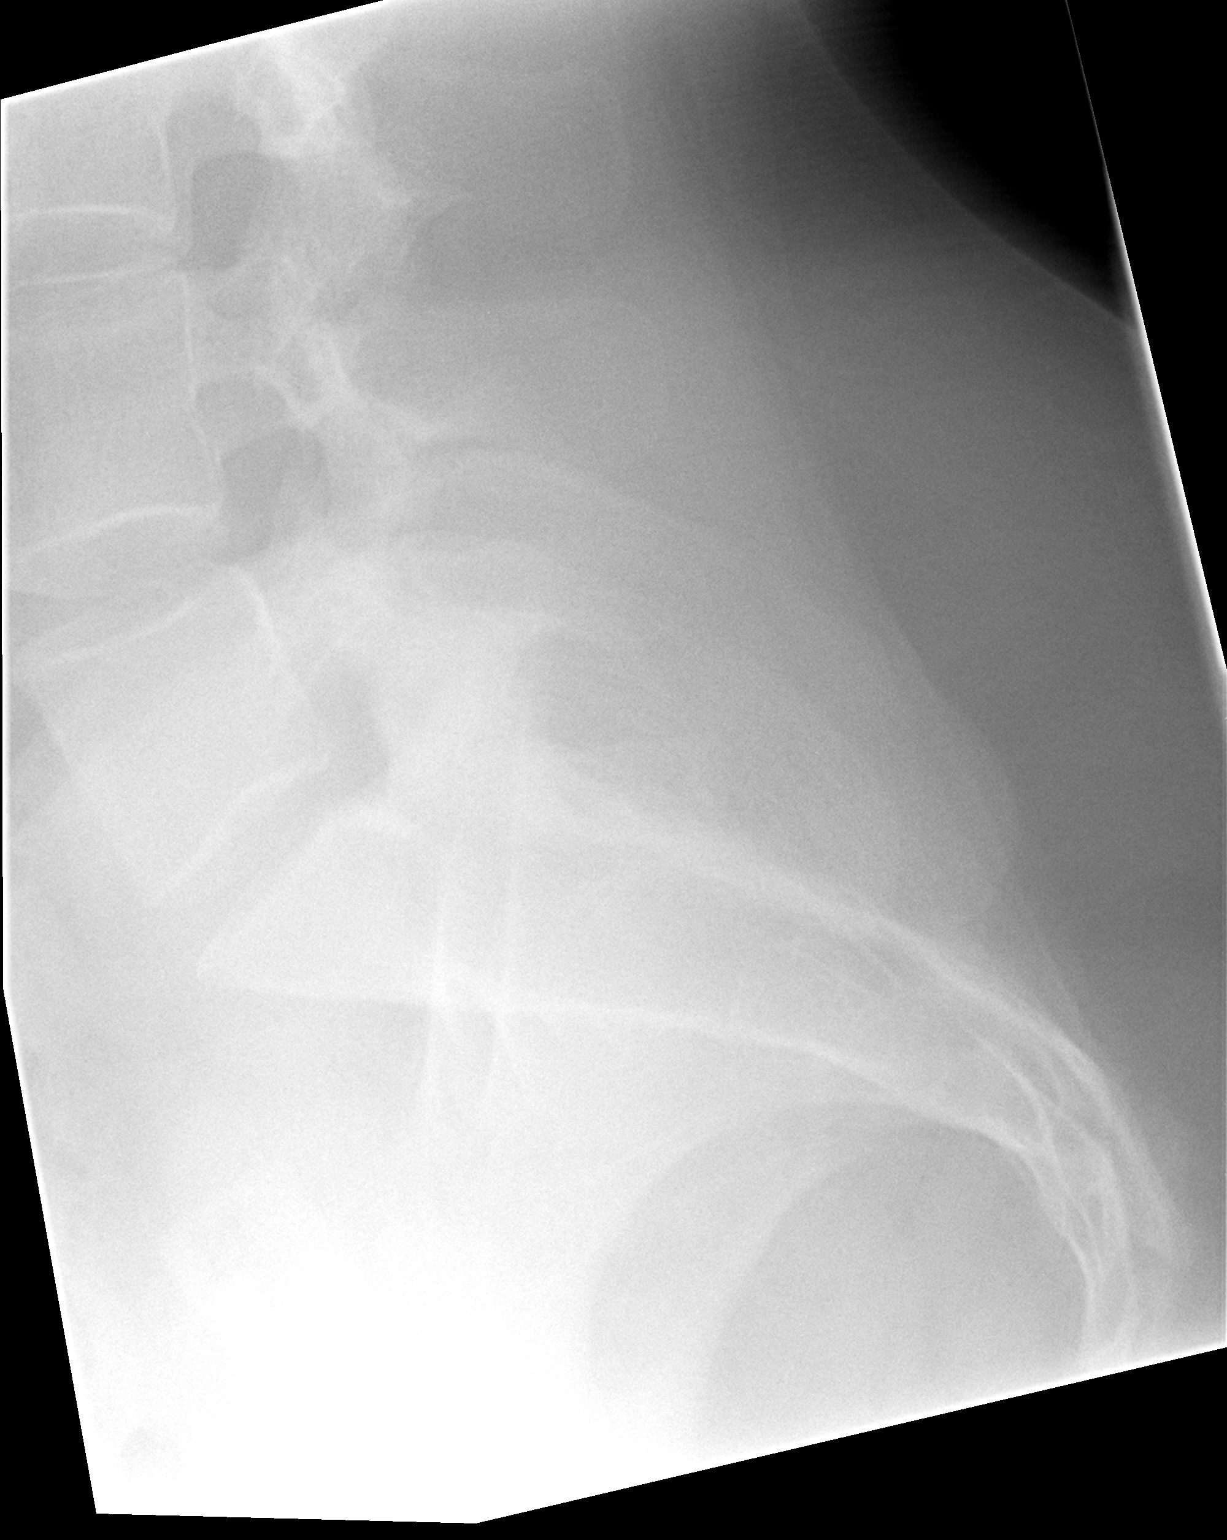

[3 of 3 positions shown; findings below may reference images not displayed]

FINDINGS: Five lumbar type vertebral bodies.

Normal lumbar lordosis.

No evidence of fracture or dislocation. Vertebral body heights and
intervertebral disc spaces are maintained.

Visualized bony pelvis appears intact.
IMPRESSION: Normal lumbar spine radiographs.

## 2016-08-09 ENCOUNTER — Other Ambulatory Visit: Payer: Self-pay | Admitting: Women's Health

## 2016-08-09 DIAGNOSIS — O26642 Intrahepatic cholestasis of pregnancy, second trimester: Secondary | ICD-10-CM

## 2016-08-09 DIAGNOSIS — K831 Obstruction of bile duct: Secondary | ICD-10-CM

## 2016-08-09 DIAGNOSIS — O26612 Liver and biliary tract disorders in pregnancy, second trimester: Secondary | ICD-10-CM

## 2016-08-09 DIAGNOSIS — Z363 Encounter for antenatal screening for malformations: Secondary | ICD-10-CM

## 2016-08-10 ENCOUNTER — Encounter: Payer: Self-pay | Admitting: Obstetrics and Gynecology

## 2016-08-10 ENCOUNTER — Ambulatory Visit (INDEPENDENT_AMBULATORY_CARE_PROVIDER_SITE_OTHER): Payer: Medicaid Other | Admitting: Obstetrics and Gynecology

## 2016-08-10 ENCOUNTER — Ambulatory Visit (INDEPENDENT_AMBULATORY_CARE_PROVIDER_SITE_OTHER): Payer: Medicaid Other

## 2016-08-10 VITALS — BP 121/73 | HR 76 | Wt 174.0 lb

## 2016-08-10 DIAGNOSIS — O321XX1 Maternal care for breech presentation, fetus 1: Secondary | ICD-10-CM

## 2016-08-10 DIAGNOSIS — Z3A20 20 weeks gestation of pregnancy: Secondary | ICD-10-CM | POA: Diagnosis not present

## 2016-08-10 DIAGNOSIS — K831 Obstruction of bile duct: Secondary | ICD-10-CM

## 2016-08-10 DIAGNOSIS — O26612 Liver and biliary tract disorders in pregnancy, second trimester: Secondary | ICD-10-CM

## 2016-08-10 DIAGNOSIS — Z1389 Encounter for screening for other disorder: Secondary | ICD-10-CM | POA: Diagnosis not present

## 2016-08-10 DIAGNOSIS — Z363 Encounter for antenatal screening for malformations: Secondary | ICD-10-CM

## 2016-08-10 DIAGNOSIS — O0992 Supervision of high risk pregnancy, unspecified, second trimester: Secondary | ICD-10-CM | POA: Diagnosis not present

## 2016-08-10 DIAGNOSIS — Z331 Pregnant state, incidental: Secondary | ICD-10-CM

## 2016-08-10 DIAGNOSIS — O0990 Supervision of high risk pregnancy, unspecified, unspecified trimester: Secondary | ICD-10-CM

## 2016-08-10 DIAGNOSIS — O26619 Liver and biliary tract disorders in pregnancy, unspecified trimester: Secondary | ICD-10-CM

## 2016-08-10 LAB — POCT URINALYSIS DIPSTICK
Glucose, UA: NEGATIVE
Ketones, UA: NEGATIVE
NITRITE UA: NEGATIVE
PROTEIN UA: NEGATIVE
RBC UA: NEGATIVE

## 2016-08-10 NOTE — Progress Notes (Signed)
US 20+1 wks,breech,anterior pl gr 0,bilat adnexa's wnl,cx 4.3 cm,svp of fluid 4.1 cm,fhr 159 bpm,efw 393 g,anatomy complete,no obvious abnormalities seen

## 2016-08-10 NOTE — Progress Notes (Signed)
Patient ID: Joann Marshall, female   DOB: 10/26/1990, 26 y.o.   MRN: [id number]   High Risk Pregnancy HROB Diagnosis(es):   ICP dx @ 14 wks  G3P1011 2369w1d Estimated Date of Delivery: 12/27/16    HPI: The patient is being seen today for ongoing management of the above.  Chief Complaint  Patient presents with  . Routine Prenatal Visit    Today she reports her current dose of Actigall has alleviated her itching.   Patient reports good fetal movement. She denies any bleeding and no rupture of membranes symptoms or regular contractions.  BP weight and urine results reviewed and noted. Blood pressure 121/73, pulse 76, weight 174 lb (78.9 kg), last menstrual period 03/02/2016, not currently breastfeeding.  Fetal Heart rate:  159 bpm Physical Examination: Abdomen - soft, nontender, nondistended, no masses or organomegaly                                     Edema:  none  Urinalysis: POSITIVE for +1 leukocytes, NEGATIVE otherwise   Fetal Surveillance Testing today:  Anatomy scan   Lab and sonogram results have been reviewed.  Assessment:  1.  Pregnancy at 1469w1d,  G3P1011   :  Estimated Date of Delivery: 12/27/16                         2.  ICP dx @ 14 wks                        3. h/o ICP w/ last pregnancy dx at ~23wks   4. Repeat labs today: bile acids and CMET and future order placed for next check in 4 wk.  Medication(s) Plans:  Continue actigall 600 mg tid  Treatment Plan:  US q4w, weekly BPP @ 28wks then 2x/wk testing at 32wks. Deliver PRN or 37wks  Monthly bile acids and cmet, and prn as clinically indicated. Follow up in 4 weeks for appointment for high risk OB care, US lab f/u   By signing my name below, I, Doreatha MartinEva Mathews, attest that this documentation has been prepared under the direction and in the presence of Tilda BurrowJohn V Delmy Holdren, MD.  Electronically Signed: Doreatha MartinEva Mathews, ED Scribe. 08/10/16. 12:51 PM.  I personally performed the services described in this documentation, which was  SCRIBED in my presence. The recorded information has been reviewed and considered accurate. It has been edited as necessary during review. Tilda BurrowFERGUSON,Antavious Spanos V, MD

## 2016-09-07 ENCOUNTER — Other Ambulatory Visit: Payer: Self-pay | Admitting: Obstetrics and Gynecology

## 2016-09-07 DIAGNOSIS — K831 Obstruction of bile duct: Secondary | ICD-10-CM

## 2016-09-10 ENCOUNTER — Ambulatory Visit (INDEPENDENT_AMBULATORY_CARE_PROVIDER_SITE_OTHER): Payer: Medicaid Other | Admitting: Women's Health

## 2016-09-10 ENCOUNTER — Ambulatory Visit (INDEPENDENT_AMBULATORY_CARE_PROVIDER_SITE_OTHER): Payer: Medicaid Other

## 2016-09-10 ENCOUNTER — Encounter: Payer: Self-pay | Admitting: Women's Health

## 2016-09-10 VITALS — BP 118/62 | HR 80 | Wt 194.0 lb

## 2016-09-10 DIAGNOSIS — Z1389 Encounter for screening for other disorder: Secondary | ICD-10-CM

## 2016-09-10 DIAGNOSIS — K831 Obstruction of bile duct: Secondary | ICD-10-CM

## 2016-09-10 DIAGNOSIS — O0990 Supervision of high risk pregnancy, unspecified, unspecified trimester: Secondary | ICD-10-CM

## 2016-09-10 DIAGNOSIS — Z3A25 25 weeks gestation of pregnancy: Secondary | ICD-10-CM | POA: Diagnosis not present

## 2016-09-10 DIAGNOSIS — O358XX1 Maternal care for other (suspected) fetal abnormality and damage, fetus 1: Secondary | ICD-10-CM | POA: Diagnosis not present

## 2016-09-10 DIAGNOSIS — Z331 Pregnant state, incidental: Secondary | ICD-10-CM | POA: Diagnosis not present

## 2016-09-10 DIAGNOSIS — O26612 Liver and biliary tract disorders in pregnancy, second trimester: Secondary | ICD-10-CM | POA: Diagnosis not present

## 2016-09-10 DIAGNOSIS — R82998 Other abnormal findings in urine: Secondary | ICD-10-CM

## 2016-09-10 DIAGNOSIS — O26613 Liver and biliary tract disorders in pregnancy, third trimester: Secondary | ICD-10-CM

## 2016-09-10 DIAGNOSIS — O321XX1 Maternal care for breech presentation, fetus 1: Secondary | ICD-10-CM

## 2016-09-10 DIAGNOSIS — O0992 Supervision of high risk pregnancy, unspecified, second trimester: Secondary | ICD-10-CM

## 2016-09-10 LAB — POCT URINALYSIS DIPSTICK
GLUCOSE UA: NEGATIVE
Ketones, UA: NEGATIVE
NITRITE UA: NEGATIVE
Protein, UA: NEGATIVE
RBC UA: NEGATIVE

## 2016-09-10 NOTE — Progress Notes (Signed)
US 24+4 wks,breech,cx 4 cm,ant pl gr 0,svp of fluid 5.2 cm,normal ov's bilat,fhr 133 bpm,bilat pyelectasis LK 5.2 mm,5.5 mm,EFW 801 g 60%

## 2016-09-10 NOTE — Patient Instructions (Signed)
Come Saturday morning for labs, nothing to eat or drink after midnight Friday night. Open at 8am close at 12pm  You will have your sugar test next visit.  Please do not eat or drink anything after midnight the night before you come, not even water.  You will be here for at least two hours.     Call the office [phone]) or go to [facility] if:  You begin to have strong, frequent contractions  Your water breaks.  Sometimes it is a big gush of fluid, sometimes it is just a trickle that keeps getting your panties wet or running down your legs  You have vaginal bleeding.  It is normal to have a small amount of spotting if your cervix was checked.   You don't feel your baby moving like normal.  If you don't, get you something to eat and drink and lay down and focus on feeling your baby move.   If your baby is still not moving like normal, you should call the office or go to [facility].  Second Trimester of Pregnancy The second trimester is from week 13 through week 28, months 4 through 6. The second trimester is often a time when you feel your best. Your body has also adjusted to being pregnant, and you begin to feel better physically. Usually, morning sickness has lessened or quit completely, you may have more energy, and you may have an increase in appetite. The second trimester is also a time when the fetus is growing rapidly. At the end of the sixth month, the fetus is about 9 inches long and weighs about 1 pounds. You will likely begin to feel the baby move (quickening) between 18 and 20 weeks of the pregnancy. BODY CHANGES Your body goes through many changes during pregnancy. The changes vary from woman to woman.   Your weight will continue to increase. You will notice your lower abdomen bulging out.  You may begin to get stretch marks on your hips, abdomen, and breasts.  You may develop headaches that can be relieved by medicines approved by your health care provider.  You may  urinate more often because the fetus is pressing on your bladder.  You may develop or continue to have heartburn as a result of your pregnancy.  You may develop constipation because certain hormones are causing the muscles that push waste through your intestines to slow down.  You may develop hemorrhoids or swollen, bulging veins (varicose veins).  You may have back pain because of the weight gain and pregnancy hormones relaxing your joints between the bones in your pelvis and as a result of a shift in weight and the muscles that support your balance.  Your breasts will continue to grow and be tender.  Your gums may bleed and may be sensitive to brushing and flossing.  Dark spots or blotches (chloasma, mask of pregnancy) may develop on your face. This will likely fade after the baby is born.  A dark line from your belly button to the pubic area (linea nigra) may appear. This will likely fade after the baby is born.  You may have changes in your hair. These can include thickening of your hair, rapid growth, and changes in texture. Some women also have hair loss during or after pregnancy, or hair that feels dry or thin. Your hair will most likely return to normal after your baby is born. WHAT TO EXPECT AT YOUR PRENATAL VISITS During a routine prenatal visit:  You  will be weighed to make sure you and the fetus are growing normally.  Your blood pressure will be taken.  Your abdomen will be measured to track your baby's growth.  The fetal heartbeat will be listened to.  Any test results from the previous visit will be discussed. Your health care provider may ask you:  How you are feeling.  If you are feeling the baby move.  If you have had any abnormal symptoms, such as leaking fluid, bleeding, severe headaches, or abdominal cramping.  If you have any questions. Other tests that may be performed during your second trimester include:  Blood tests that check for:  Low iron levels  (anemia).  Gestational diabetes (between 24 and 28 weeks).  Rh antibodies.  Urine tests to check for infections, diabetes, or protein in the urine.  An ultrasound to confirm the proper growth and development of the baby.  An amniocentesis to check for possible genetic problems.  Fetal screens for spina bifida and Down syndrome. HOME CARE INSTRUCTIONS   Avoid all smoking, herbs, alcohol, and unprescribed drugs. These chemicals affect the formation and growth of the baby.  Follow your health care provider's instructions regarding medicine use. There are medicines that are either safe or unsafe to take during pregnancy.  Exercise only as directed by your health care provider. Experiencing uterine cramps is a good sign to stop exercising.  Continue to eat regular, healthy meals.  Wear a good support bra for breast tenderness.  Do not use hot tubs, steam rooms, or saunas.  Wear your seat belt at all times when driving.  Avoid raw meat, uncooked cheese, cat litter boxes, and soil used by cats. These carry germs that can cause birth defects in the baby.  Take your prenatal vitamins.  Try taking a stool softener (if your health care provider approves) if you develop constipation. Eat more high-fiber foods, such as fresh vegetables or fruit and whole grains. Drink plenty of fluids to keep your urine clear or pale yellow.  Take warm sitz baths to soothe any pain or discomfort caused by hemorrhoids. Use hemorrhoid cream if your health care provider approves.  If you develop varicose veins, wear support hose. Elevate your feet for 15 minutes, 3-4 times a day. Limit salt in your diet.  Avoid heavy lifting, wear low heel shoes, and practice good posture.  Rest with your legs elevated if you have leg cramps or low back pain.  Visit your dentist if you have not gone yet during your pregnancy. Use a soft toothbrush to brush your teeth and be gentle when you floss.  A sexual relationship  may be continued unless your health care provider directs you otherwise.  Continue to go to all your prenatal visits as directed by your health care provider. SEEK MEDICAL CARE IF:   You have dizziness.  You have mild pelvic cramps, pelvic pressure, or nagging pain in the abdominal area.  You have persistent nausea, vomiting, or diarrhea.  You have a bad smelling vaginal discharge.  You have pain with urination. SEEK IMMEDIATE MEDICAL CARE IF:   You have a fever.  You are leaking fluid from your vagina.  You have spotting or bleeding from your vagina.  You have severe abdominal cramping or pain.  You have rapid weight gain or loss.  You have shortness of breath with chest pain.  You notice sudden or extreme swelling of your face, hands, ankles, feet, or legs.  You have not felt your baby move  in over an hour.  You have severe headaches that do not go away with medicine.  You have vision changes. Document Released: 07/10/2001 Document Revised: 07/21/2013 Document Reviewed: 09/16/2012 [facility] Patient Information 2015 [place]. This information is not intended to replace advice given to you by your health care provider. Make sure you discuss any questions you have with your health care provider.

## 2016-09-10 NOTE — Progress Notes (Signed)
High Risk Pregnancy Diagnosis(es): ICP G3P1011 427w4d Estimated Date of Delivery: 12/27/16 BP 118/62   Pulse 80   Wt 194 lb (88 kg)   LMP 03/02/2016 (Exact Date)   BMI 28.65 kg/m   Urinalysis: Positive for small leuks HPI:  NO more itching on actigall 600mg  TID!!! Never got repeat bile acids/CMP in Jan- lab was closed for lunch and she came on lunch break for visit so couldn't stay BP, weight, and urine reviewed.  Reports good fm. Denies regular uc's, lof, vb, uti s/s. No complaints.  Fundal Height:  24 Fetal Heart rate:  133 u/s Edema:  none  Reviewed ptl s/s, fm. Discussed today's u/s: normal efw/afi, bilateral pyelectasis All questions were answered Assessment: 307w4d ICP Medication(s) Plans:  Continue actigall 600mg  TID Treatment Plan: Growth u/s q3wks, weekly bpp @ 28wks, then 2x/wk testing @ 32wks, Deliver @ 37wks Follow up in 3wks (@28wks ) for high-risk OB appt, pn2, and bpp/efw/afi u/s Come this Sat am for fasting bile acids and CMP

## 2016-09-12 LAB — URINE CULTURE

## 2016-09-18 LAB — COMPREHENSIVE METABOLIC PANEL
ALBUMIN: 4.2 g/dL (ref 3.5–5.5)
ALK PHOS: 123 IU/L — AB (ref 39–117)
ALT: 13 IU/L (ref 0–32)
AST: 13 IU/L (ref 0–40)
Albumin/Globulin Ratio: 1.6 (ref 1.2–2.2)
BUN / CREAT RATIO: 19 (ref 9–23)
BUN: 10 mg/dL (ref 6–20)
CHLORIDE: 100 mmol/L (ref 96–106)
CO2: 20 mmol/L (ref 18–29)
Calcium: 9.3 mg/dL (ref 8.7–10.2)
Creatinine, Ser: 0.52 mg/dL — ABNORMAL LOW (ref 0.57–1.00)
GFR calc Af Amer: 152 mL/min/{1.73_m2} (ref >59)
GFR calc non Af Amer: 132 mL/min/{1.73_m2} (ref >59)
GLUCOSE: 87 mg/dL (ref 65–99)
Globulin, Total: 2.6 g/dL (ref 1.5–4.5)
POTASSIUM: 4.4 mmol/L (ref 3.5–5.2)
Sodium: 137 mmol/L (ref 134–144)
Total Protein: 6.8 g/dL (ref 6.0–8.5)

## 2016-09-18 LAB — BILE ACIDS, TOTAL: BILE ACIDS TOTAL: 7.2 umol/L (ref 4.7–24.5)

## 2016-10-01 ENCOUNTER — Other Ambulatory Visit: Payer: Medicaid Other

## 2016-10-02 ENCOUNTER — Ambulatory Visit (INDEPENDENT_AMBULATORY_CARE_PROVIDER_SITE_OTHER): Payer: Medicaid Other | Admitting: Obstetrics & Gynecology

## 2016-10-02 ENCOUNTER — Other Ambulatory Visit: Payer: Medicaid Other

## 2016-10-02 ENCOUNTER — Ambulatory Visit (INDEPENDENT_AMBULATORY_CARE_PROVIDER_SITE_OTHER): Payer: Medicaid Other

## 2016-10-02 ENCOUNTER — Encounter: Payer: Self-pay | Admitting: Obstetrics & Gynecology

## 2016-10-02 VITALS — BP 110/60 | HR 74 | Wt 194.0 lb

## 2016-10-02 DIAGNOSIS — O0990 Supervision of high risk pregnancy, unspecified, unspecified trimester: Secondary | ICD-10-CM

## 2016-10-02 DIAGNOSIS — K831 Obstruction of bile duct: Secondary | ICD-10-CM

## 2016-10-02 DIAGNOSIS — Z1389 Encounter for screening for other disorder: Secondary | ICD-10-CM

## 2016-10-02 DIAGNOSIS — O0992 Supervision of high risk pregnancy, unspecified, second trimester: Secondary | ICD-10-CM

## 2016-10-02 DIAGNOSIS — Z331 Pregnant state, incidental: Secondary | ICD-10-CM

## 2016-10-02 DIAGNOSIS — Z3A28 28 weeks gestation of pregnancy: Secondary | ICD-10-CM | POA: Diagnosis not present

## 2016-10-02 DIAGNOSIS — O26613 Liver and biliary tract disorders in pregnancy, third trimester: Secondary | ICD-10-CM

## 2016-10-02 DIAGNOSIS — O0993 Supervision of high risk pregnancy, unspecified, third trimester: Secondary | ICD-10-CM

## 2016-10-02 DIAGNOSIS — Z131 Encounter for screening for diabetes mellitus: Secondary | ICD-10-CM

## 2016-10-02 DIAGNOSIS — O26612 Liver and biliary tract disorders in pregnancy, second trimester: Secondary | ICD-10-CM

## 2016-10-02 LAB — POCT URINALYSIS DIPSTICK
Glucose, UA: NEGATIVE
Ketones, UA: NEGATIVE
Leukocytes, UA: NEGATIVE
Nitrite, UA: NEGATIVE
RBC UA: NEGATIVE

## 2016-10-02 NOTE — Progress Notes (Addendum)
US 27+5 wks,cephalic,cx 4.2 cm,normal ov's bilat,ant pl gr 1,right renal pelvis 4 mm,left renal pelvis 5.1 mm,LK [place] pyelectasis,fhr 169 bpm,afi 13.4 cm,efw 1424 g 80%,BPP 8/8

## 2016-10-02 NOTE — Progress Notes (Signed)
Fetal Surveillance Testing today:  BPP 8/8   High Risk Pregnancy Diagnosis(es):   ICP  G3P1011 4178w5d Estimated Date of Delivery: 12/27/16  Blood pressure 110/60, pulse 74, weight 194 lb (88 kg), last menstrual period 03/02/2016, not currently breastfeeding.  Urinalysis: Negative   HPI: The patient is being seen today for ongoing management of as above. Today she reports no complaints, no itching   BP weight and urine results all reviewed and noted. Patient reports good fetal movement, denies any bleeding and no rupture of membranes symptoms or regular contractions.  Fundal Height:   Fetal Heart rate:  169 Edema:  none  Patient is without complaints other than noted in her HPI. All questions were answered.  All lab and sonogram results have been reviewed. Comments:    Assessment:  1.  Pregnancy at 7478w5d,  Estimated Date of Delivery: 12/27/16 :                          2.  ICP                        3.    Medication(s) Plans:  Continue actigal 600 TID  Treatment Plan:  Begin twice weekly surveillance at 32 weeks, sonogram BPP in 2 weeks  Return in about 2 weeks (around 10/16/2016) for BPP/sono, HROB. for appointment for high risk OB care  No orders of the defined types were placed in this encounter.  Orders Placed This Encounter  Procedures  . US Fetal BPP W/O Non Stress  . Bile acids, total  . POCT urinalysis dipstick

## 2016-10-03 ENCOUNTER — Other Ambulatory Visit: Payer: Self-pay | Admitting: Women's Health

## 2016-10-03 ENCOUNTER — Telehealth: Payer: Self-pay | Admitting: *Deleted

## 2016-10-03 LAB — ANTIBODY SCREEN: Antibody Screen: NEGATIVE

## 2016-10-03 LAB — RPR: RPR Ser Ql: NONREACTIVE

## 2016-10-03 LAB — CBC
Hematocrit: 31.1 % — ABNORMAL LOW (ref 34.0–46.6)
Hemoglobin: 10.4 g/dL — ABNORMAL LOW (ref 11.1–15.9)
MCH: 27.5 pg (ref 26.6–33.0)
MCHC: 33.4 g/dL (ref 31.5–35.7)
MCV: 82 fL (ref 79–97)
PLATELETS: 348 10*3/uL (ref 150–379)
RBC: 3.78 x10E6/uL (ref 3.77–5.28)
RDW: 14.2 % (ref 12.3–15.4)
WBC: 15.4 10*3/uL — ABNORMAL HIGH (ref 3.4–10.8)

## 2016-10-03 LAB — GLUCOSE TOLERANCE, 2 HOURS W/ 1HR
GLUCOSE, FASTING: 77 mg/dL (ref 65–91)
Glucose, 1 hour: 111 mg/dL (ref 65–179)
Glucose, 2 hour: 105 mg/dL (ref 65–152)

## 2016-10-03 LAB — HIV ANTIBODY (ROUTINE TESTING W REFLEX): HIV Screen 4th Generation wRfx: NONREACTIVE

## 2016-10-03 MED ORDER — FERROUS SULFATE 325 (65 FE) MG PO TABS: 325.0000 mg | ORAL_TABLET | Freq: Two times a day (BID) | ORAL | 3 refills | Status: DC

## 2016-10-03 NOTE — Telephone Encounter (Signed)
LMOVM that she is anemic, rx for fe to her pharmacy and to make sure she's taking pnv daily, increase fe-rich foods: red meats, green leafy vegs, beans, etc.

## 2016-10-16 ENCOUNTER — Other Ambulatory Visit: Payer: Medicaid Other

## 2016-10-16 ENCOUNTER — Encounter: Payer: Medicaid Other | Admitting: Women's Health

## 2016-10-17 ENCOUNTER — Ambulatory Visit (INDEPENDENT_AMBULATORY_CARE_PROVIDER_SITE_OTHER): Payer: Medicaid Other | Admitting: Obstetrics and Gynecology

## 2016-10-17 ENCOUNTER — Encounter: Payer: Self-pay | Admitting: Obstetrics and Gynecology

## 2016-10-17 ENCOUNTER — Ambulatory Visit (INDEPENDENT_AMBULATORY_CARE_PROVIDER_SITE_OTHER): Payer: Medicaid Other

## 2016-10-17 VITALS — BP 118/70 | HR 94 | Wt 200.0 lb

## 2016-10-17 DIAGNOSIS — Z331 Pregnant state, incidental: Secondary | ICD-10-CM

## 2016-10-17 DIAGNOSIS — O0990 Supervision of high risk pregnancy, unspecified, unspecified trimester: Secondary | ICD-10-CM

## 2016-10-17 DIAGNOSIS — O26613 Liver and biliary tract disorders in pregnancy, third trimester: Secondary | ICD-10-CM | POA: Diagnosis not present

## 2016-10-17 DIAGNOSIS — Z1389 Encounter for screening for other disorder: Secondary | ICD-10-CM

## 2016-10-17 DIAGNOSIS — K831 Obstruction of bile duct: Secondary | ICD-10-CM | POA: Diagnosis not present

## 2016-10-17 DIAGNOSIS — O0993 Supervision of high risk pregnancy, unspecified, third trimester: Secondary | ICD-10-CM | POA: Diagnosis not present

## 2016-10-17 DIAGNOSIS — O26612 Liver and biliary tract disorders in pregnancy, second trimester: Secondary | ICD-10-CM

## 2016-10-17 LAB — POCT URINALYSIS DIPSTICK
Blood, UA: NEGATIVE
Glucose, UA: NEGATIVE
KETONES UA: NEGATIVE
LEUKOCYTES UA: NEGATIVE
Nitrite, UA: NEGATIVE

## 2016-10-17 NOTE — Progress Notes (Signed)
   Fetal Surveillance Testing today:  US Fetal BPP  High Risk Pregnancy HROB Diagnosis(es):   ICP  G3P1011 276w6d Estimated Date of Delivery: 12/27/16    HPI: The patient is being seen today for ongoing management of above. Today she reports no acute complaints or symptoms. Patient reports good fetal movement, denies any bleeding and no rupture of membranes symptoms or regular contractions.   BP weight and urine results reviewed and noted. Blood pressure 118/70, pulse 94, weight 200 lb (90.7 kg), last menstrual period 03/02/2016, not currently breastfeeding.  Fundal Height:  30 cm Fetal Heart rate:  169 bpm Physical Examination: Abdomen - soft, nontender, nondistended, no  organomegaly                                     Pelvic - examination not indicated                                     Urinalysis: POSITIVE for trace protein   Lab and sonogram results have been reviewed.  Assessment:  1.  Pregnancy at 516w6d,  G3P1011                          2.  ICP                        3.   Medication(s) Plans:  Continue actigal 600 TID  Treatment Plan:  Begin twice weekly surveillance at 32 weeks  Follow up in 1 week for appointment for high risk OB care, and BPP.  By signing my name below, I, Freida BusmanDiana Omoyeni, attest that this documentation has been prepared under the direction and in the presence of Tilda BurrowJohn V Joycie Aerts, MD . Electronically Signed: Freida Busmaniana Omoyeni, Scribe. 10/17/2016. 3:40 PM. I personally performed the services described in this documentation, which was SCRIBED in my presence. The recorded information has been reviewed and considered accurate. It has been edited as necessary during review. Tilda BurrowFERGUSON,Samaia Iwata V, MD

## 2016-10-17 NOTE — Progress Notes (Signed)
US 29+6 wks,cephalic,BPP 8/8,ant pl gr 2,afi 16 cm,normal ov's bilat,fhr 140 bpm,left renal pelvis 5.1 mm,right renal pelvis 4.3 mm N/C WNL,

## 2016-10-23 ENCOUNTER — Other Ambulatory Visit: Payer: Self-pay | Admitting: Obstetrics and Gynecology

## 2016-10-23 DIAGNOSIS — O26613 Liver and biliary tract disorders in pregnancy, third trimester: Principal | ICD-10-CM

## 2016-10-23 DIAGNOSIS — K831 Obstruction of bile duct: Secondary | ICD-10-CM

## 2016-10-24 ENCOUNTER — Ambulatory Visit (INDEPENDENT_AMBULATORY_CARE_PROVIDER_SITE_OTHER): Payer: Medicaid Other | Admitting: Advanced Practice Midwife

## 2016-10-24 ENCOUNTER — Ambulatory Visit (INDEPENDENT_AMBULATORY_CARE_PROVIDER_SITE_OTHER): Payer: Medicaid Other

## 2016-10-24 ENCOUNTER — Encounter: Payer: Self-pay | Admitting: Advanced Practice Midwife

## 2016-10-24 VITALS — BP 102/68 | HR 80 | Wt 197.0 lb

## 2016-10-24 DIAGNOSIS — Z331 Pregnant state, incidental: Secondary | ICD-10-CM | POA: Diagnosis not present

## 2016-10-24 DIAGNOSIS — O0990 Supervision of high risk pregnancy, unspecified, unspecified trimester: Secondary | ICD-10-CM

## 2016-10-24 DIAGNOSIS — Z1389 Encounter for screening for other disorder: Secondary | ICD-10-CM

## 2016-10-24 DIAGNOSIS — O26613 Liver and biliary tract disorders in pregnancy, third trimester: Secondary | ICD-10-CM | POA: Diagnosis not present

## 2016-10-24 DIAGNOSIS — Z6791 Unspecified blood type, Rh negative: Secondary | ICD-10-CM

## 2016-10-24 DIAGNOSIS — K831 Obstruction of bile duct: Secondary | ICD-10-CM

## 2016-10-24 LAB — POCT URINALYSIS DIPSTICK
GLUCOSE UA: NEGATIVE
Ketones, UA: NEGATIVE
Leukocytes, UA: NEGATIVE
NITRITE UA: NEGATIVE
RBC UA: NEGATIVE

## 2016-10-24 MED ORDER — RHO D IMMUNE GLOBULIN 1500 UNIT/2ML IJ SOSY
300.0000 ug | PREFILLED_SYRINGE | Freq: Once | INTRAMUSCULAR | Status: AC
  Administered 2016-10-24: 300 ug via INTRAMUSCULAR

## 2016-10-24 NOTE — Progress Notes (Signed)
Fetal Surveillance Testing today:  BPP   High Risk Pregnancy Diagnosis(es):   ICP  G3P1011 6523w6d Estimated Date of Delivery: 12/27/16  Blood pressure 102/68, pulse 80, weight 197 lb (89.4 kg), last menstrual period 03/02/2016, not currently breastfeeding.  Urinalysis: NegativeNegative   HPI: The patient is being seen today for ongoing management of the above. Today she reports no itching.  Did not get bile acids cheked in early march.    BP weight and urine results all reviewed and noted. Patient reports good fetal movement, denies any bleeding and no rupture of membranes symptoms or regular contractions.  Edema:  no  Patient is without complaints other than noted in her HPI. All questions were answered.  All lab and sonogram results have been reviewed. Comments:  pylectasis <805mm (not significant)  US 30+6 wks,cephalic,AFI 18.9 cm,ant pl gr 3,BPP 8/8,FHR 153 bpm,  Assessment:  1.  Pregnancy at 1423w6d,  Estimated Date of Delivery: 12/27/16 :  ICP                        2.                          3.    Medication(s) Plans:  Continue actigal 600mg  TID  Treatment Plan:  Start twice weekly testing at 32 weeks w/growth US q 3 weeks. IOL 37 weeks.  Get bile acids/cmp asap when fasting  Return in 8 days (on 11/01/2016) for HROB, NST. for appointment for high risk OB care  Meds ordered this encounter  Medications  . rho (d) immune globulin (RHIG/RHOPHYLAC) injection 300 mcg   Orders Placed This Encounter  Procedures  . Comprehensive metabolic panel  . Bile acids, total  . POCT urinalysis dipstick

## 2016-10-24 NOTE — Progress Notes (Signed)
US 30+6 wks,cephalic,AFI 18.9 cm,ant pl gr 3,BPP 8/8,FHR 153 bpm,

## 2016-11-01 ENCOUNTER — Other Ambulatory Visit: Payer: Medicaid Other | Admitting: Advanced Practice Midwife

## 2016-11-05 ENCOUNTER — Ambulatory Visit (INDEPENDENT_AMBULATORY_CARE_PROVIDER_SITE_OTHER): Payer: Medicaid Other | Admitting: Obstetrics and Gynecology

## 2016-11-05 ENCOUNTER — Encounter: Payer: Self-pay | Admitting: Obstetrics and Gynecology

## 2016-11-05 VITALS — BP 122/72 | HR 89 | Wt 196.4 lb

## 2016-11-05 DIAGNOSIS — Z331 Pregnant state, incidental: Secondary | ICD-10-CM | POA: Diagnosis not present

## 2016-11-05 DIAGNOSIS — Z1389 Encounter for screening for other disorder: Secondary | ICD-10-CM

## 2016-11-05 DIAGNOSIS — K831 Obstruction of bile duct: Secondary | ICD-10-CM | POA: Diagnosis not present

## 2016-11-05 DIAGNOSIS — O0993 Supervision of high risk pregnancy, unspecified, third trimester: Secondary | ICD-10-CM | POA: Diagnosis not present

## 2016-11-05 DIAGNOSIS — O26613 Liver and biliary tract disorders in pregnancy, third trimester: Secondary | ICD-10-CM

## 2016-11-05 DIAGNOSIS — O26619 Liver and biliary tract disorders in pregnancy, unspecified trimester: Secondary | ICD-10-CM

## 2016-11-05 DIAGNOSIS — O0990 Supervision of high risk pregnancy, unspecified, unspecified trimester: Secondary | ICD-10-CM

## 2016-11-05 LAB — POCT URINALYSIS DIPSTICK
Blood, UA: NEGATIVE
GLUCOSE UA: NEGATIVE
KETONES UA: NEGATIVE
Nitrite, UA: NEGATIVE
Protein, UA: NEGATIVE

## 2016-11-05 NOTE — Progress Notes (Signed)
Patient ID: Joann Marshall, female   DOB: [birth year], 26 y.o.   MRN: [id number]   High Risk Pregnancy HROB Diagnosis(es):   ICP  G3P1011 [redacted]w[redacted]d Estimated Date of Delivery: 12/27/16     HPI: The patient is being seen today for ongoing management of the above.  Chief Complaint  Patient presents with  . High Risk Gestation    NST; ? leaking fluid; + pressure  ____  Today she reports she has had some occasional clear vaginal discharge. She denies vaginal itching.   Patient reports good fetal movement. She denies any bleeding and no regular contractions.  BP weight and urine results reviewed and noted. Blood pressure 122/72, pulse 89, weight 196 lb 6.4 oz (89.1 kg), last menstrual period 03/02/2016, not currently breastfeeding.  Fundal Height:  34 cm Fetal Heart rate:  140 bpm with accels to 170  Physical Examination: Abdomen - soft, nontender, nondistended, no masses or organomegaly                                     Pelvic - VULVA: normal appearing vulva with no masses, tenderness or lesions,      VAGINA: normal appearing vagina with normal color and discharge, no lesions, discharge not consistent with membrane rupture      CERVIX: normal appearing cervix without discharge or lesions                                     Edema:  None  Wet prep: positive for WBC, negative for tric, yeast and ROM KOH: negative  Secretion pH 7  Urinalysis: POSITIVE for trace leukocytes   Fetal Surveillance Testing today:  NST reactive   Lab and sonogram results have been reviewed.  Assessment:  1.  Pregnancy at [redacted]w[redacted]d,  G3P1011   :  Estimated Date of Delivery: 12/27/16                         2.  ICP  Medication(s) Plans:  Continue actigal  TID   Treatment Plan:   1. Twice weekly testing w/growth [place] q 3 weeks.  2. IOL 37 weeks.   3. Obtain fasting bile salts at patient's earliest convenience (already has lab slip)   Follow up in 3 days for appointment for high risk OB care, twice weekly  testing    By signing my name below, I, Doreatha Martin, attest that this documentation has been prepared under the direction and in the presence of Tilda Burrow, MD. Electronically Signed: Doreatha Martin, ED Scribe. 11/05/16. 3:56 PM.  I personally performed the services described in this documentation, which was SCRIBED in my presence. The recorded information has been reviewed and considered accurate. It has been edited as necessary during review. Tilda Burrow, MD

## 2016-11-08 ENCOUNTER — Other Ambulatory Visit: Payer: Medicaid Other | Admitting: Obstetrics and Gynecology

## 2016-11-13 ENCOUNTER — Encounter: Payer: Self-pay | Admitting: Obstetrics & Gynecology

## 2016-11-13 ENCOUNTER — Ambulatory Visit (INDEPENDENT_AMBULATORY_CARE_PROVIDER_SITE_OTHER): Payer: Medicaid Other | Admitting: Obstetrics & Gynecology

## 2016-11-13 VITALS — BP 110/80 | HR 78 | Wt 198.0 lb

## 2016-11-13 DIAGNOSIS — Z331 Pregnant state, incidental: Secondary | ICD-10-CM | POA: Diagnosis not present

## 2016-11-13 DIAGNOSIS — G809 Cerebral palsy, unspecified: Secondary | ICD-10-CM

## 2016-11-13 DIAGNOSIS — Z1389 Encounter for screening for other disorder: Secondary | ICD-10-CM | POA: Diagnosis not present

## 2016-11-13 DIAGNOSIS — O26613 Liver and biliary tract disorders in pregnancy, third trimester: Secondary | ICD-10-CM | POA: Diagnosis not present

## 2016-11-13 DIAGNOSIS — K831 Obstruction of bile duct: Secondary | ICD-10-CM | POA: Diagnosis not present

## 2016-11-13 LAB — POCT URINALYSIS DIPSTICK
GLUCOSE UA: NEGATIVE
Leukocytes, UA: NEGATIVE
NITRITE UA: NEGATIVE
RBC UA: NEGATIVE

## 2016-11-13 NOTE — Progress Notes (Signed)
Fetal Surveillance Testing today:  Reactive NST   High Risk Pregnancy Diagnosis(es):   ICP  G3P1011 [redacted]w[redacted]d Estimated Date of Delivery: 12/27/16  Blood pressure 110/80, pulse 78, weight 198 lb (89.8 kg), last menstrual period 03/02/2016, not currently breastfeeding.  Urinalysis: Negative   HPI: The patient is being seen today for ongoing management of ICP. Today she reports some increased itching over the past 10 days   BP weight and urine results all reviewed and noted. Patient reports good fetal movement, denies any bleeding and no rupture of membranes symptoms or regular contractions.  Fundal Height:  34 Fetal Heart rate:  145 Edema:  none  Patient is without complaints other than noted in her HPI. All questions were answered.  All lab and sonogram results have been reviewed. Comments:    Assessment:  1.  Pregnancy at [redacted]w[redacted]d,  Estimated Date of Delivery: 12/27/16 :                          2.  ICP, severe                        3.    Medication(s) Plans:  Continue ursodiol 600 TID  Treatment Plan:  Check bile acids this week along with LFTs, continue twice weekly surveillance  Return in about 8 weeks (around 01/11/2017) for BPP/sono, HROB, with Dr Despina Hidden. for appointment for high risk OB care  No orders of the defined types were placed in this encounter.  Orders Placed This Encounter  Procedures  . US OB Follow Up  . US FETAL BPP W/NONSTRESS  . POCT urinalysis dipstick

## 2016-11-15 ENCOUNTER — Other Ambulatory Visit: Payer: Self-pay | Admitting: Obstetrics & Gynecology

## 2016-11-15 DIAGNOSIS — K831 Obstruction of bile duct: Secondary | ICD-10-CM

## 2016-11-15 DIAGNOSIS — O26613 Liver and biliary tract disorders in pregnancy, third trimester: Principal | ICD-10-CM

## 2016-11-16 ENCOUNTER — Ambulatory Visit (INDEPENDENT_AMBULATORY_CARE_PROVIDER_SITE_OTHER): Payer: Medicaid Other | Admitting: Obstetrics & Gynecology

## 2016-11-16 ENCOUNTER — Ambulatory Visit (INDEPENDENT_AMBULATORY_CARE_PROVIDER_SITE_OTHER): Payer: Medicaid Other

## 2016-11-16 ENCOUNTER — Encounter: Payer: Self-pay | Admitting: Obstetrics & Gynecology

## 2016-11-16 VITALS — BP 100/62 | HR 72 | Wt 199.0 lb

## 2016-11-16 DIAGNOSIS — O0993 Supervision of high risk pregnancy, unspecified, third trimester: Secondary | ICD-10-CM

## 2016-11-16 DIAGNOSIS — G809 Cerebral palsy, unspecified: Secondary | ICD-10-CM

## 2016-11-16 DIAGNOSIS — K831 Obstruction of bile duct: Secondary | ICD-10-CM | POA: Diagnosis not present

## 2016-11-16 DIAGNOSIS — O0990 Supervision of high risk pregnancy, unspecified, unspecified trimester: Secondary | ICD-10-CM

## 2016-11-16 DIAGNOSIS — Z331 Pregnant state, incidental: Secondary | ICD-10-CM | POA: Diagnosis not present

## 2016-11-16 DIAGNOSIS — O26613 Liver and biliary tract disorders in pregnancy, third trimester: Secondary | ICD-10-CM

## 2016-11-16 DIAGNOSIS — O26643 Intrahepatic cholestasis of pregnancy, third trimester: Secondary | ICD-10-CM

## 2016-11-16 DIAGNOSIS — Z1389 Encounter for screening for other disorder: Secondary | ICD-10-CM | POA: Diagnosis not present

## 2016-11-16 LAB — POCT URINALYSIS DIPSTICK
Glucose, UA: NEGATIVE
Ketones, UA: NEGATIVE
NITRITE UA: NEGATIVE
PROTEIN UA: NEGATIVE
RBC UA: NEGATIVE

## 2016-11-16 NOTE — Progress Notes (Signed)
Fetal Surveillance Testing today:  BPP 8/8   High Risk Pregnancy Diagnosis(es):   ICP, severe  G3P1011 [redacted]w[redacted]d Estimated Date of Delivery: 12/27/16  Blood pressure 100/62, pulse 72, weight 199 lb (90.3 kg), last menstrual period 03/02/2016, not currently breastfeeding.  Urinalysis: Negative   HPI: The patient is being seen today for ongoing management of as above. Today she reports itching continues to be an issue   BP weight and urine results all reviewed and noted. Patient reports good fetal movement, denies any bleeding and no rupture of membranes symptoms or regular contractions.  Fundal Height:  34 Fetal Heart rate:  141 Edema:  none  Patient is without complaints other than noted in her HPI. All questions were answered.  All lab and sonogram results have been reviewed. Comments:    Assessment:  1.  Pregnancy at [redacted]w[redacted]d,  Estimated Date of Delivery: 12/27/16 :                          2.  ICP,                        3.    Medication(s) Plans:  Ursodiol 600 TID,   Treatment Plan:  Check bile acids tomorrow am fasting, twie weekly surveillance induction 37 weeks  Return in about 4 days (around 11/20/2016) for NST, HROB. for appointment for high risk OB care  No orders of the defined types were placed in this encounter.  Orders Placed This Encounter  Procedures  . POCT urinalysis dipstick

## 2016-11-16 NOTE — Progress Notes (Signed)
[place] 34+1 wks,cephalic,ant pl gr 3,afi 18 cm,bilat adnexa's wnl,BPP 8/8,FHR 143 BPM,bilat pyelectasis LK 7.5 mm,RK 6.8 mm,EFW 2757 g 72%,AC 96%

## 2016-11-19 ENCOUNTER — Inpatient Hospital Stay (HOSPITAL_COMMUNITY)
Admission: AD | Admit: 2016-11-19 | Discharge: 2016-11-19 | Disposition: A | Discharge status: Home / Self Care | Payer: Medicaid Other | Source: Ambulatory Visit | Attending: Obstetrics & Gynecology | Admitting: Obstetrics & Gynecology

## 2016-11-19 ENCOUNTER — Encounter (HOSPITAL_COMMUNITY): Payer: Self-pay

## 2016-11-19 DIAGNOSIS — R42 Dizziness and giddiness: Secondary | ICD-10-CM | POA: Insufficient documentation

## 2016-11-19 DIAGNOSIS — O4703 False labor before 37 completed weeks of gestation, third trimester: Secondary | ICD-10-CM | POA: Insufficient documentation

## 2016-11-19 DIAGNOSIS — Z79899 Other long term (current) drug therapy: Secondary | ICD-10-CM | POA: Diagnosis not present

## 2016-11-19 DIAGNOSIS — R0602 Shortness of breath: Secondary | ICD-10-CM | POA: Insufficient documentation

## 2016-11-19 DIAGNOSIS — R109 Unspecified abdominal pain: Secondary | ICD-10-CM | POA: Diagnosis not present

## 2016-11-19 DIAGNOSIS — Z3A34 34 weeks gestation of pregnancy: Secondary | ICD-10-CM | POA: Insufficient documentation

## 2016-11-19 DIAGNOSIS — Z880 Allergy status to penicillin: Secondary | ICD-10-CM | POA: Insufficient documentation

## 2016-11-19 DIAGNOSIS — O26613 Liver and biliary tract disorders in pregnancy, third trimester: Secondary | ICD-10-CM | POA: Diagnosis not present

## 2016-11-19 DIAGNOSIS — Z8249 Family history of ischemic heart disease and other diseases of the circulatory system: Secondary | ICD-10-CM | POA: Insufficient documentation

## 2016-11-19 DIAGNOSIS — O479 False labor, unspecified: Secondary | ICD-10-CM

## 2016-11-19 DIAGNOSIS — Z811 Family history of alcohol abuse and dependence: Secondary | ICD-10-CM | POA: Diagnosis not present

## 2016-11-19 DIAGNOSIS — O26893 Other specified pregnancy related conditions, third trimester: Secondary | ICD-10-CM | POA: Diagnosis not present

## 2016-11-19 LAB — URINALYSIS, ROUTINE W REFLEX MICROSCOPIC
BILIRUBIN URINE: NEGATIVE
GLUCOSE, UA: NEGATIVE mg/dL
HGB URINE DIPSTICK: NEGATIVE
KETONES UR: NEGATIVE mg/dL
NITRITE: NEGATIVE
PH: 5 (ref 5.0–8.0)
PROTEIN: NEGATIVE mg/dL
Specific Gravity, Urine: 1.023 (ref 1.005–1.030)

## 2016-11-19 LAB — COMPREHENSIVE METABOLIC PANEL
ALBUMIN: 3.5 g/dL (ref 3.5–5.5)
ALT: 30 IU/L (ref 0–32)
AST: 18 IU/L (ref 0–40)
Albumin/Globulin Ratio: 1.1 — ABNORMAL LOW (ref 1.2–2.2)
Alkaline Phosphatase: 215 IU/L — ABNORMAL HIGH (ref 39–117)
BUN / CREAT RATIO: 14 (ref 9–23)
BUN: 7 mg/dL (ref 6–20)
Bilirubin Total: <0.2 mg/dL (ref 0.0–1.2)
CALCIUM: 9.2 mg/dL (ref 8.7–10.2)
CHLORIDE: 100 mmol/L (ref 96–106)
CO2: 18 mmol/L (ref 18–29)
CREATININE: 0.49 mg/dL — AB (ref 0.57–1.00)
GFR, EST AFRICAN AMERICAN: 156 mL/min/{1.73_m2} (ref >59)
GFR, EST NON AFRICAN AMERICAN: 135 mL/min/{1.73_m2} (ref >59)
GLUCOSE: 87 mg/dL (ref 65–99)
Globulin, Total: 3.1 g/dL (ref 1.5–4.5)
Potassium: 4.5 mmol/L (ref 3.5–5.2)
Sodium: 136 mmol/L (ref 134–144)
TOTAL PROTEIN: 6.6 g/dL (ref 6.0–8.5)

## 2016-11-19 LAB — OB RESULTS CONSOLE GBS: STREP GROUP B AG: NEGATIVE

## 2016-11-19 LAB — BILE ACIDS, TOTAL: Bile Acids Total: 6 umol/L (ref 4.7–24.5)

## 2016-11-19 NOTE — MAU Note (Addendum)
Pt states that she was at work today and started feeling dizzy and like her heart was racing. Pt states she felt short of breath and like she was going to pass out. Pt states her fingers got tingly. Pt states the shortness of breath has continued since then. Pt states she called the MD office and they told her to come to hospital. Pt states in the car on the way to hospital she started feeling some cramping. Pt states the cramping is irregular but it is different than normal for her. Pt states baby is moving normally. Pt denies bleeding and leaking of fluid.

## 2016-11-19 NOTE — MAU Provider Note (Signed)
History     CSN: [id number]  Arrival date and time: 11/19/16 1657   None     Chief Complaint  Patient presents with  . Shortness of Breath  . Abdominal Cramping   HPI   [name] is a 26 y.o. female G3P1011 @ [redacted]w[redacted]d here in MAU after an episode of shortness of breath. She was at work and felt hot, felt SOB and felt like she was going to pass out. She did not pass out. This happened around 1500, She had a normal lunch around 1300. The symptoms subsided after 1 hour. She did not feel anxious at the time. She called her fiance who came and picked her up. Since her arrival she has not felt SOB. Her symptoms have completely resolved.   Denies bleeding or leaking of water. + fetal movement.   OB History    Gravida Para Term Preterm AB Living   SAB TAB Ectopic Multiple Live Births   1     0 1      Past Medical History:  Diagnosis Date  . 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 Headache   . Hx of varicella   . Partial septate uterus   . Renal disorder   . UTI (urinary tract infection)     Past Surgical History:  Procedure Laterality Date  . NO PAST SURGERIES      Family History  Problem Relation Age of Onset  . Heart disease Paternal Grandfather   . Other Maternal Grandfather     Heart surgery  . Alcohol abuse Maternal Grandfather     Social History  Substance Use Topics  . Smoking status: Never Smoker  . Smokeless tobacco: Never Used  . Alcohol use No    Allergies:  Allergies  Allergen Reactions  . Amoxicillin     Has patient had a PCN reaction causing immediate rash, facial/tongue/throat swelling, SOB or lightheadedness with hypotension:unknown Has patient had a PCN reaction causing severe rash involving mucus membranes or skin necrosis: unknown Has patient had a PCN reaction that required hospitalization unknown Has patient had a PCN reaction occurring within the last 10 years:unknown If all of the above answers are "NO",  then may proceed with Cephalosporin use.     Prescriptions Prior to Admission  Medication Sig Dispense Refill Last Dose  . calcium carbonate (TUMS - DOSED IN MG ELEMENTAL CALCIUM) 500 MG chewable tablet Chew 1 tablet by mouth 4 (four) times daily as needed for indigestion or heartburn.   Past Week at Unknown time  . Prenatal Vit-Fe Fumarate-FA (PRENATAL MULTIVITAMIN) TABS tablet Take 1 tablet by mouth daily at 12 noon. 60 tablet 3 11/19/2016 at Unknown time  . ursodiol (ACTIGALL) 500 MG tablet Take 500 mg by mouth 3 (three) times daily.   0 11/19/2016 at Unknown time   Results for orders placed or performed during the hospital encounter of 11/19/16 (from the past 48 hour(s))  Urinalysis, Routine w reflex microscopic     Status: Abnormal   Collection Time: 11/19/16  5:05 PM  Result Value Ref Range   Color, Urine YELLOW YELLOW   APPearance HAZY (A) CLEAR   Specific Gravity, Urine 1.023 1.005 - 1.030   pH 5.0 5.0 - 8.0   Glucose, UA NEGATIVE NEGATIVE mg/dL   Hgb urine dipstick NEGATIVE NEGATIVE   Bilirubin Urine NEGATIVE NEGATIVE   Ketones, ur NEGATIVE NEGATIVE mg/dL   Protein, ur NEGATIVE NEGATIVE  mg/dL   Nitrite NEGATIVE NEGATIVE   Leukocytes, UA MODERATE (A) NEGATIVE   RBC / HPF 0-5 0 - 5 RBC/hpf   WBC, UA 6-30 0 - 5 WBC/hpf   Bacteria, UA RARE (A) NONE SEEN   Squamous Epithelial / LPF 0-5 (A) NONE SEEN   Mucous PRESENT     Review of Systems  Respiratory: Negative for shortness of breath.   Gastrointestinal: Positive for abdominal pain.  Neurological: Negative for dizziness.   Physical Exam   Blood pressure 128/81, pulse 90, temperature 98.8 F (37.1 C), temperature source Oral, resp. rate 18, height  (1.753 m), weight 199 lb (90.3 kg), last menstrual period 03/02/2016, SpO2 97 %, not currently breastfeeding.  Physical Exam  Constitutional: She is oriented to person, place, and time. She appears well-nourished. No distress.  HENT:  Head: Normocephalic.  Eyes: Pupils  are equal, round, and reactive to light.  Cardiovascular: Normal rate.   Respiratory: Effort normal. No respiratory distress.  Genitourinary:  Genitourinary Comments: Dilation: Closed Effacement (%): Thick Exam by:: jennifer rasch,np  Musculoskeletal: Normal range of motion.  Neurological: She is alert and oriented to person, place, and time.  Skin: Skin is warm. She is not diaphoretic.  Psychiatric: Her behavior is normal.   Fetal Tracing: Baseline: 125 bpm Variability: Moderate  Accelerations: 15x15 Decelerations: None Toco: Occasional contraction   MAU Course  Procedures  None  MDM  Spo2: 97%-100%  UA Urine culture Orthostatic vitals: elevation of HR with standing. Patient walked around the unit without SOB, or dizziness.   Assessment and Plan    A:  1. Braxton Hick's contraction   2. Episode of dizziness     P:  Discharge home in stable condition  Strict return precautions  May consider cardiology referral if symptoms return-persist Increase PO fluid intake Small, frequent meals.   Duane Lope, NP 11/19/2016 7:24 PM

## 2016-11-19 NOTE — Discharge Instructions (Signed)

## 2016-11-20 ENCOUNTER — Other Ambulatory Visit: Payer: Medicaid Other | Admitting: Obstetrics & Gynecology

## 2016-11-21 LAB — CULTURE, OB URINE: Special Requests: NORMAL

## 2016-11-23 ENCOUNTER — Encounter: Payer: Self-pay | Admitting: Obstetrics & Gynecology

## 2016-11-23 ENCOUNTER — Other Ambulatory Visit: Payer: Medicaid Other

## 2016-11-23 ENCOUNTER — Ambulatory Visit (INDEPENDENT_AMBULATORY_CARE_PROVIDER_SITE_OTHER): Payer: Medicaid Other | Admitting: Obstetrics & Gynecology

## 2016-11-23 VITALS — BP 118/78 | HR 95 | Wt 201.0 lb

## 2016-11-23 DIAGNOSIS — Z3A35 35 weeks gestation of pregnancy: Secondary | ICD-10-CM

## 2016-11-23 DIAGNOSIS — K831 Obstruction of bile duct: Secondary | ICD-10-CM | POA: Diagnosis not present

## 2016-11-23 DIAGNOSIS — Z1389 Encounter for screening for other disorder: Secondary | ICD-10-CM | POA: Diagnosis not present

## 2016-11-23 DIAGNOSIS — O26613 Liver and biliary tract disorders in pregnancy, third trimester: Secondary | ICD-10-CM | POA: Diagnosis not present

## 2016-11-23 DIAGNOSIS — Z331 Pregnant state, incidental: Secondary | ICD-10-CM

## 2016-11-23 DIAGNOSIS — O0993 Supervision of high risk pregnancy, unspecified, third trimester: Secondary | ICD-10-CM | POA: Diagnosis not present

## 2016-11-23 DIAGNOSIS — O0990 Supervision of high risk pregnancy, unspecified, unspecified trimester: Secondary | ICD-10-CM

## 2016-11-23 LAB — POCT URINALYSIS DIPSTICK
Glucose, UA: NEGATIVE
KETONES UA: NEGATIVE
Nitrite, UA: NEGATIVE
RBC UA: NEGATIVE

## 2016-11-23 NOTE — Treatment Plan (Signed)
   Induction Assessment Scheduling Form: Fax to [facility]:  [phone]  Joann Marshall                                                                                   DOB:  [birth year]                                                            MRN:  [id number]                                                                     Phone #:   [phone]                         Provider:  [facility]  GP:  [id number]                                                            Estimated Date of Delivery: 12/27/16  Dating Criteria: first trimester sonogram    Medical Indications for induction:  Cholestasis of pregnancy, severe Admission Date/Time:  12/06/2016@0700  Gestational age on admission:  [redacted]w[redacted]d   Filed Weights   11/23/16 1154  Weight: 201 lb (91.2 kg)   HIV:  Non Reactive (03/06 0912) GBS:    Cervical exam pending   Method of induction(proposed):  cytotec   Scheduling Provider Signature:  Lazaro Arms, MD                                            Today's Date:  11/23/2016 11/23/2016 12:50 PM   Scheduled with Herbert Seta

## 2016-11-23 NOTE — Progress Notes (Signed)
Fetal Surveillance Testing today:  Reactive NST   High Risk Pregnancy Diagnosis(es):   ICP  G3P1011 [redacted]w[redacted]d Estimated Date of Delivery: 12/27/16  Blood pressure 118/78, pulse 95, weight 201 lb (91.2 kg), last menstrual period 03/02/2016, not currently breastfeeding.  Urinalysis: Negative   HPI: The patient is being seen today for ongoing management of ICP. Today she reports itchingis about the same   BP weight and urine results all reviewed and noted. Patient reports good fetal movement, denies any bleeding and no rupture of membranes symptoms or regular contractions.  Fundal Height:  35 Fetal Heart rate:  140 Edema:  none  Patient is without complaints other than noted in her HPI. All questions were answered.  All lab and sonogram results have been reviewed. Comments:    Assessment:  1.  Pregnancy at [redacted]w[redacted]d,  Estimated Date of Delivery: 12/27/16 :                          2.  ICP, well controlled on actigall 500 TID                        3.    Medication(s) Plans:  actigall 500 TID  Treatment Plan:  Twice weekly surveillance with induction 37 weeks, 12/06/2016@0700   Return in about 4 days (around 11/27/2016) for BPP/sono, HROB. for appointment for high risk OB care  No orders of the defined types were placed in this encounter.  Orders Placed This Encounter  Procedures  . US FETAL BPP W/NONSTRESS  . POCT urinalysis dipstick

## 2016-11-27 ENCOUNTER — Encounter: Payer: Self-pay | Admitting: Women's Health

## 2016-11-27 ENCOUNTER — Ambulatory Visit (INDEPENDENT_AMBULATORY_CARE_PROVIDER_SITE_OTHER): Payer: Medicaid Other

## 2016-11-27 ENCOUNTER — Ambulatory Visit (INDEPENDENT_AMBULATORY_CARE_PROVIDER_SITE_OTHER): Payer: Medicaid Other | Admitting: Women's Health

## 2016-11-27 VITALS — BP 110/64 | HR 88 | Wt 202.0 lb

## 2016-11-27 DIAGNOSIS — O26613 Liver and biliary tract disorders in pregnancy, third trimester: Secondary | ICD-10-CM

## 2016-11-27 DIAGNOSIS — Z331 Pregnant state, incidental: Secondary | ICD-10-CM

## 2016-11-27 DIAGNOSIS — O0993 Supervision of high risk pregnancy, unspecified, third trimester: Secondary | ICD-10-CM

## 2016-11-27 DIAGNOSIS — K831 Obstruction of bile duct: Secondary | ICD-10-CM

## 2016-11-27 DIAGNOSIS — O0990 Supervision of high risk pregnancy, unspecified, unspecified trimester: Secondary | ICD-10-CM

## 2016-11-27 DIAGNOSIS — Z1389 Encounter for screening for other disorder: Secondary | ICD-10-CM

## 2016-11-27 LAB — POCT URINALYSIS DIPSTICK
Glucose, UA: NEGATIVE
Ketones, UA: NEGATIVE
Leukocytes, UA: NEGATIVE
Nitrite, UA: NEGATIVE
RBC UA: NEGATIVE

## 2016-11-27 NOTE — Patient Instructions (Signed)
Call the office ([phone]) or go to [facility] if:  You begin to have strong, frequent contractions  Your water breaks.  Sometimes it is a big gush of fluid, sometimes it is just a trickle that keeps getting your panties wet or running down your legs  You have vaginal bleeding.  It is normal to have a small amount of spotting if your cervix was checked.   You don't feel your baby moving like normal.  If you don't, get you something to eat and drink and lay down and focus on feeling your baby move.  You should feel at least 10 movements in 2 hours.  If you don't, you should call the office or go to [facility].     Preterm Labor and Birth Information The normal length of a pregnancy is 39-41 weeks. Preterm labor is when labor starts before 37 completed weeks of pregnancy. What are the risk factors for preterm labor? Preterm labor is more likely to occur in women who:  Have certain infections during pregnancy such as a bladder infection, sexually transmitted infection, or infection inside the uterus (chorioamnionitis).  Have a shorter-than-normal cervix.  Have gone into preterm labor before.  Have had surgery on their cervix.  Are younger than age 17 or older than age 35.  Are African American.  Are pregnant with twins or multiple babies (multiple gestation).  Take street drugs or smoke while pregnant.  Do not gain enough weight while pregnant.  Became pregnant shortly after having been pregnant. What are the symptoms of preterm labor? Symptoms of preterm labor include:  Cramps similar to those that can happen during a menstrual period. The cramps may happen with diarrhea.  Pain in the abdomen or lower back.  Regular uterine contractions that may feel like tightening of the abdomen.  A feeling of increased pressure in the pelvis.  Increased watery or bloody mucus discharge from the vagina.  Water breaking (ruptured amniotic sac). Why is it important to  recognize signs of preterm labor? It is important to recognize signs of preterm labor because babies who are born prematurely may not be fully developed. This can put them at an increased risk for:  Long-term (chronic) heart and lung problems.  Difficulty immediately after birth with regulating body systems, including blood sugar, body temperature, heart rate, and breathing rate.  Bleeding in the brain.  Cerebral palsy.  Learning difficulties.  Death. These risks are highest for babies who are born before 34 weeks of pregnancy. How is preterm labor treated? Treatment depends on the length of your pregnancy, your condition, and the health of your baby. It may involve:  Having a stitch (suture) placed in your cervix to prevent your cervix from opening too early (cerclage).  Taking or being given medicines, such as:  Hormone medicines. These may be given early in pregnancy to help support the pregnancy.  Medicine to stop contractions.  Medicines to help mature the baby's lungs. These may be prescribed if the risk of delivery is high.  Medicines to prevent your baby from developing cerebral palsy. If the labor happens before 34 weeks of pregnancy, you may need to stay in the hospital. What should I do if I think I am in preterm labor? If you think that you are going into preterm labor, call your health care provider right away. How can I prevent preterm labor in future pregnancies? To increase your chance of having a full-term pregnancy:  Do not use any tobacco products, such   as cigarettes, chewing tobacco, and e-cigarettes. If you need help quitting, ask your health care provider.  Do not use street drugs or medicines that have not been prescribed to you during your pregnancy.  Talk with your health care provider before taking any herbal supplements, even if you have been taking them regularly.  Make sure you gain a healthy amount of weight during your pregnancy.  Watch for  infection. If you think that you might have an infection, get it checked right away.  Make sure to tell your health care provider if you have gone into preterm labor before. This information is not intended to replace advice given to you by your health care provider. Make sure you discuss any questions you have with your health care provider. Document Released: 10/06/2003 Document Revised: 12/27/2015 Document Reviewed: 12/07/2015 Elsevier Interactive Patient Education  2017 [employer]  

## 2016-11-27 NOTE — Progress Notes (Signed)
[place] 35+5 wks,cephalic,BPP 8/8,ant pl gr 3,afi 21 cm,fhr 150 bpm,normal ov's bilat

## 2016-11-27 NOTE — Progress Notes (Signed)
High Risk Pregnancy Diagnosis(es): Cholestasis G3P1011 [redacted]w[redacted]d Estimated Date of Delivery: 12/27/16 BP 110/64   Pulse 88   Wt 202 lb (91.6 kg)   LMP 03/02/2016 (Exact Date)   BMI 29.83 kg/m   Urinalysis: Positive for tr protein HPI:  Doing well, no complaints BP, weight, and urine reviewed.  Reports good fm. Denies regular uc's, lof, vb, uti s/s.   Fundal Height:  35 Fetal Heart rate:  150 u/s Edema: none  Reviewed today's u/s: bpp 8/8, afi 21cm. Discussed ptl s/s, fkc All questions were answered Assessment: [redacted]w[redacted]d Cholestasis Medication(s) Plans:  Continue actigall  TID Treatment Plan:  2x/wk testing, IOL @ 37wks Follow up in 3d for high-risk OB appt and NST

## 2016-11-29 ENCOUNTER — Telehealth (HOSPITAL_COMMUNITY): Payer: Self-pay | Admitting: *Deleted

## 2016-11-29 NOTE — Telephone Encounter (Signed)
Preadmission screen  

## 2016-11-30 ENCOUNTER — Ambulatory Visit (INDEPENDENT_AMBULATORY_CARE_PROVIDER_SITE_OTHER): Payer: Medicaid Other | Admitting: Obstetrics & Gynecology

## 2016-11-30 ENCOUNTER — Encounter: Payer: Self-pay | Admitting: Obstetrics & Gynecology

## 2016-11-30 VITALS — BP 118/62 | HR 68 | Wt 202.0 lb

## 2016-11-30 DIAGNOSIS — Z1389 Encounter for screening for other disorder: Secondary | ICD-10-CM | POA: Diagnosis not present

## 2016-11-30 DIAGNOSIS — Z3A36 36 weeks gestation of pregnancy: Secondary | ICD-10-CM

## 2016-11-30 DIAGNOSIS — O26613 Liver and biliary tract disorders in pregnancy, third trimester: Secondary | ICD-10-CM

## 2016-11-30 DIAGNOSIS — Z331 Pregnant state, incidental: Secondary | ICD-10-CM

## 2016-11-30 DIAGNOSIS — O0993 Supervision of high risk pregnancy, unspecified, third trimester: Secondary | ICD-10-CM | POA: Diagnosis not present

## 2016-11-30 DIAGNOSIS — Z3483 Encounter for supervision of other normal pregnancy, third trimester: Secondary | ICD-10-CM

## 2016-11-30 DIAGNOSIS — K831 Obstruction of bile duct: Secondary | ICD-10-CM

## 2016-11-30 DIAGNOSIS — O0990 Supervision of high risk pregnancy, unspecified, unspecified trimester: Secondary | ICD-10-CM

## 2016-11-30 LAB — POCT URINALYSIS DIPSTICK
Blood, UA: NEGATIVE
Glucose, UA: NEGATIVE
Ketones, UA: NEGATIVE
NITRITE UA: NEGATIVE
PROTEIN UA: NEGATIVE

## 2016-11-30 LAB — OB RESULTS CONSOLE GBS: STREP GROUP B AG: NEGATIVE

## 2016-11-30 NOTE — Progress Notes (Signed)
Fetal Surveillance Testing today:  Reactive NST   High Risk Pregnancy Diagnosis(es):   Cholestasis of pregnancy, severe  G3P1011 453w1d Estimated Date of Delivery: 12/27/16  Blood pressure 118/62, pulse 68, weight 202 lb (91.6 kg), last menstrual period 03/02/2016, not currently breastfeeding.  Urinalysis: Negative   HPI: The patient is being seen today for ongoing management of ICP, severe. Today she reports no changes in her itching   BP weight and urine results all reviewed and noted. Patient reports good fetal movement, denies any bleeding and no rupture of membranes symptoms or regular contractions.  Fundal Height:  36 Fetal Heart rate:  130 Edema:  none  Patient is without complaints other than noted in her HPI. All questions were answered.  All lab and sonogram results have been reviewed. Comments:    Assessment:  1.  Pregnancy at 523w1d,  Estimated Date of Delivery: 12/27/16 :                          2.  Cholestasis of pregnancy, severe with elevated LFT both improved on ursodiol 500 TID                        3.    Medication(s) Plans:  Ursodiol 500 TID  Treatment Plan:  NST Tuesday, induction Thursday  Return in about 4 days (around 12/04/2016) for NST, HROB. for appointment for high risk OB care  No orders of the defined types were placed in this encounter.  Orders Placed This Encounter  Procedures  . Strep Gp B NAA+Rflx  . GC/Chlamydia Probe Amp  . POCT urinalysis dipstick

## 2016-12-02 LAB — STREP GP B NAA+RFLX: Strep Gp B NAA+Rflx: NEGATIVE

## 2016-12-03 LAB — GC/CHLAMYDIA PROBE AMP
Chlamydia trachomatis, NAA: NEGATIVE
Neisseria gonorrhoeae by PCR: NEGATIVE

## 2016-12-04 ENCOUNTER — Ambulatory Visit (INDEPENDENT_AMBULATORY_CARE_PROVIDER_SITE_OTHER): Payer: Medicaid Other | Admitting: Obstetrics & Gynecology

## 2016-12-04 ENCOUNTER — Encounter: Payer: Self-pay | Admitting: Obstetrics & Gynecology

## 2016-12-04 VITALS — BP 104/72 | HR 88 | Wt 204.0 lb

## 2016-12-04 DIAGNOSIS — O26613 Liver and biliary tract disorders in pregnancy, third trimester: Secondary | ICD-10-CM

## 2016-12-04 DIAGNOSIS — K831 Obstruction of bile duct: Secondary | ICD-10-CM | POA: Diagnosis not present

## 2016-12-04 DIAGNOSIS — Z331 Pregnant state, incidental: Secondary | ICD-10-CM

## 2016-12-04 DIAGNOSIS — O0993 Supervision of high risk pregnancy, unspecified, third trimester: Secondary | ICD-10-CM | POA: Diagnosis not present

## 2016-12-04 DIAGNOSIS — Z1389 Encounter for screening for other disorder: Secondary | ICD-10-CM

## 2016-12-04 DIAGNOSIS — Z3A36 36 weeks gestation of pregnancy: Secondary | ICD-10-CM

## 2016-12-04 DIAGNOSIS — O26643 Intrahepatic cholestasis of pregnancy, third trimester: Secondary | ICD-10-CM

## 2016-12-04 DIAGNOSIS — O0990 Supervision of high risk pregnancy, unspecified, unspecified trimester: Secondary | ICD-10-CM

## 2016-12-04 LAB — POCT URINALYSIS DIPSTICK
GLUCOSE UA: NEGATIVE
Ketones, UA: NEGATIVE
Nitrite, UA: NEGATIVE
Protein, UA: NEGATIVE
RBC UA: NEGATIVE

## 2016-12-04 NOTE — Progress Notes (Signed)
Fetal Surveillance Testing today:  Reactive NST   High Risk Pregnancy Diagnosis(es):   Cholestasis, severe range  G3P1011 3161w5d Estimated Date of Delivery: 12/27/16  Blood pressure 104/72, pulse 88, weight 204 lb (92.5 kg), last menstrual period 03/02/2016, not currently breastfeeding.  Urinalysis: Negative   HPI: The patient is being seen today for ongoing management of Cholestasis. Today she reports stable itching   BP weight and urine results all reviewed and noted. Patient reports good fetal movement, denies any bleeding and no rupture of membranes symptoms or regular contractions.  Fundal Height:  37 Fetal Heart rate:  135 Edema:  none  Patient is without complaints other than noted in her HPI. All questions were answered.  All lab and sonogram results have been reviewed. Comments:    Assessment:  1.  Pregnancy at 10561w5d,  Estimated Date of Delivery: 12/27/16 :                          2.  Cholestasis, severe                        3.    Medication(s) Plans:  Ursodiol 500 TID  Treatment Plan:  Induction 37 weeks  Return in about 2 weeks (around 12/18/2016) for Follow up, with Dr Despina HiddenEure. for appointment for high risk OB care  No orders of the defined types were placed in this encounter.  Orders Placed This Encounter  Procedures  . POCT Urinalysis Dipstick

## 2016-12-05 ENCOUNTER — Encounter: Payer: Self-pay | Admitting: Obstetrics and Gynecology

## 2016-12-05 ENCOUNTER — Other Ambulatory Visit: Payer: Self-pay | Admitting: Advanced Practice Midwife

## 2016-12-06 ENCOUNTER — Inpatient Hospital Stay (HOSPITAL_COMMUNITY): Payer: Medicaid Other | Admitting: Anesthesiology

## 2016-12-06 ENCOUNTER — Inpatient Hospital Stay (HOSPITAL_COMMUNITY)
Admission: RE | Admit: 2016-12-06 | Discharge: 2016-12-07 | DRG: 775 | Disposition: A | Discharge status: Home / Self Care | Payer: Medicaid Other | Source: Ambulatory Visit | Attending: Obstetrics & Gynecology | Admitting: Obstetrics & Gynecology

## 2016-12-06 ENCOUNTER — Encounter (HOSPITAL_COMMUNITY): Payer: Self-pay

## 2016-12-06 VITALS — BP 123/66 | HR 62 | Temp 97.6°F | Resp 16 | Ht 69.0 in | Wt 204.0 lb

## 2016-12-06 DIAGNOSIS — O99324 Drug use complicating childbirth: Secondary | ICD-10-CM | POA: Diagnosis present

## 2016-12-06 DIAGNOSIS — O26613 Liver and biliary tract disorders in pregnancy, third trimester: Secondary | ICD-10-CM

## 2016-12-06 DIAGNOSIS — Z880 Allergy status to penicillin: Secondary | ICD-10-CM

## 2016-12-06 DIAGNOSIS — O2662 Liver and biliary tract disorders in childbirth: Secondary | ICD-10-CM | POA: Diagnosis present

## 2016-12-06 DIAGNOSIS — O26899 Other specified pregnancy related conditions, unspecified trimester: Secondary | ICD-10-CM

## 2016-12-06 DIAGNOSIS — Q512 Other doubling of uterus, unspecified: Secondary | ICD-10-CM

## 2016-12-06 DIAGNOSIS — O26893 Other specified pregnancy related conditions, third trimester: Secondary | ICD-10-CM | POA: Diagnosis present

## 2016-12-06 DIAGNOSIS — O26643 Intrahepatic cholestasis of pregnancy, third trimester: Secondary | ICD-10-CM | POA: Diagnosis present

## 2016-12-06 DIAGNOSIS — O26619 Liver and biliary tract disorders in pregnancy, unspecified trimester: Secondary | ICD-10-CM

## 2016-12-06 DIAGNOSIS — O3403 Maternal care for unspecified congenital malformation of uterus, third trimester: Secondary | ICD-10-CM | POA: Diagnosis present

## 2016-12-06 DIAGNOSIS — Z6791 Unspecified blood type, Rh negative: Secondary | ICD-10-CM

## 2016-12-06 DIAGNOSIS — K831 Obstruction of bile duct: Secondary | ICD-10-CM | POA: Diagnosis present

## 2016-12-06 DIAGNOSIS — Z3A37 37 weeks gestation of pregnancy: Secondary | ICD-10-CM

## 2016-12-06 DIAGNOSIS — Z8249 Family history of ischemic heart disease and other diseases of the circulatory system: Secondary | ICD-10-CM | POA: Diagnosis not present

## 2016-12-06 DIAGNOSIS — O0990 Supervision of high risk pregnancy, unspecified, unspecified trimester: Secondary | ICD-10-CM

## 2016-12-06 DIAGNOSIS — O3400 Maternal care for unspecified congenital malformation of uterus, unspecified trimester: Secondary | ICD-10-CM

## 2016-12-06 DIAGNOSIS — F1290 Cannabis use, unspecified, uncomplicated: Secondary | ICD-10-CM | POA: Diagnosis present

## 2016-12-06 LAB — CBC
HCT: 30.3 % — ABNORMAL LOW (ref 36.0–46.0)
Hemoglobin: 10.1 g/dL — ABNORMAL LOW (ref 12.0–15.0)
MCH: 25.4 pg — ABNORMAL LOW (ref 26.0–34.0)
MCHC: 33.3 g/dL (ref 30.0–36.0)
MCV: 76.3 fL — ABNORMAL LOW (ref 78.0–100.0)
Platelets: 312 10*3/uL (ref 150–400)
RBC: 3.97 MIL/uL (ref 3.87–5.11)
RDW: 14 % (ref 11.5–15.5)
WBC: 14.4 10*3/uL — ABNORMAL HIGH (ref 4.0–10.5)

## 2016-12-06 LAB — RAPID URINE DRUG SCREEN, HOSP PERFORMED
AMPHETAMINES: NOT DETECTED
Barbiturates: NOT DETECTED
Benzodiazepines: NOT DETECTED
COCAINE: NOT DETECTED
OPIATES: NOT DETECTED
TETRAHYDROCANNABINOL: NOT DETECTED

## 2016-12-06 LAB — RPR: RPR: NONREACTIVE

## 2016-12-06 MED ORDER — EPHEDRINE 5 MG/ML INJ
10.0000 mg | INTRAVENOUS | Status: DC | PRN
  Filled 2016-12-06: qty 2

## 2016-12-06 MED ORDER — LACTATED RINGERS IV SOLN: 500.0000 mL | Freq: Once | INTRAVENOUS | Status: DC

## 2016-12-06 MED ORDER — TERBUTALINE SULFATE 1 MG/ML IJ SOLN
0.2500 mg | Freq: Once | INTRAMUSCULAR | Status: DC | PRN
Start: 2016-12-06 — End: 2016-12-07
  Filled 2016-12-06: qty 1

## 2016-12-06 MED ORDER — OXYTOCIN 40 UNITS IN LACTATED RINGERS INFUSION - SIMPLE MED
2.5000 [IU]/h | INTRAVENOUS | Status: DC
  Filled 2016-12-06: qty 1000

## 2016-12-06 MED ORDER — ONDANSETRON HCL 4 MG/2ML IJ SOLN: 4.0000 mg | Freq: Four times a day (QID) | INTRAMUSCULAR | Status: DC | PRN

## 2016-12-06 MED ORDER — COCONUT OIL OIL: 1.0000 "application " | TOPICAL_OIL | Status: DC | PRN

## 2016-12-06 MED ORDER — PHENYLEPHRINE 40 MCG/ML (10ML) SYRINGE FOR IV PUSH (FOR BLOOD PRESSURE SUPPORT)
80.0000 ug | PREFILLED_SYRINGE | INTRAVENOUS | Status: DC | PRN
  Filled 2016-12-06: qty 10
  Filled 2016-12-06: qty 5

## 2016-12-06 MED ORDER — OXYCODONE-ACETAMINOPHEN 5-325 MG PO TABS: 2.0000 | ORAL_TABLET | ORAL | Status: DC | PRN

## 2016-12-06 MED ORDER — ZOLPIDEM TARTRATE 5 MG PO TABS: 5.0000 mg | ORAL_TABLET | Freq: Every evening | ORAL | Status: DC | PRN

## 2016-12-06 MED ORDER — MISOPROSTOL 25 MCG QUARTER TABLET
25.0000 ug | ORAL_TABLET | ORAL | Status: DC | PRN
  Filled 2016-12-06: qty 1

## 2016-12-06 MED ORDER — BENZOCAINE-MENTHOL 20-0.5 % EX AERO
1.0000 "application " | INHALATION_SPRAY | CUTANEOUS | Status: DC | PRN
  Administered 2016-12-06: 1 via TOPICAL
  Filled 2016-12-06: qty 56

## 2016-12-06 MED ORDER — FENTANYL 2.5 MCG/ML BUPIVACAINE 1/10 % EPIDURAL INFUSION (WH - ANES)
14.0000 mL/h | INTRAMUSCULAR | Status: DC | PRN
  Administered 2016-12-06: 14 mL/h via EPIDURAL
  Filled 2016-12-06: qty 100

## 2016-12-06 MED ORDER — DIPHENHYDRAMINE HCL 25 MG PO CAPS: 25.0000 mg | ORAL_CAPSULE | Freq: Four times a day (QID) | ORAL | Status: DC | PRN

## 2016-12-06 MED ORDER — LACTATED RINGERS IV SOLN: 500.0000 mL | INTRAVENOUS | Status: DC | PRN

## 2016-12-06 MED ORDER — DIPHENHYDRAMINE HCL 50 MG/ML IJ SOLN: 12.5000 mg | INTRAMUSCULAR | Status: DC | PRN

## 2016-12-06 MED ORDER — IBUPROFEN 600 MG PO TABS
600.0000 mg | ORAL_TABLET | Freq: Four times a day (QID) | ORAL | Status: DC
  Administered 2016-12-06 – 2016-12-07 (×4): 600 mg via ORAL
  Filled 2016-12-06 (×4): qty 1

## 2016-12-06 MED ORDER — LACTATED RINGERS IV SOLN
INTRAVENOUS | Status: DC
  Administered 2016-12-06 (×2): via INTRAVENOUS

## 2016-12-06 MED ORDER — PRENATAL MULTIVITAMIN CH
1.0000 | ORAL_TABLET | Freq: Every day | ORAL | Status: DC
  Administered 2016-12-07: 1 via ORAL
  Filled 2016-12-06: qty 1

## 2016-12-06 MED ORDER — SENNOSIDES-DOCUSATE SODIUM 8.6-50 MG PO TABS
2.0000 | ORAL_TABLET | ORAL | Status: DC
  Administered 2016-12-06: 2 via ORAL
  Filled 2016-12-06: qty 2

## 2016-12-06 MED ORDER — ONDANSETRON HCL 4 MG PO TABS: 4.0000 mg | ORAL_TABLET | ORAL | Status: DC | PRN

## 2016-12-06 MED ORDER — SIMETHICONE 80 MG PO CHEW: 80.0000 mg | CHEWABLE_TABLET | ORAL | Status: DC | PRN

## 2016-12-06 MED ORDER — ACETAMINOPHEN 325 MG PO TABS
650.0000 mg | ORAL_TABLET | ORAL | Status: DC | PRN
  Administered 2016-12-06: 650 mg via ORAL
  Filled 2016-12-06: qty 2

## 2016-12-06 MED ORDER — PHENYLEPHRINE 40 MCG/ML (10ML) SYRINGE FOR IV PUSH (FOR BLOOD PRESSURE SUPPORT)
80.0000 ug | PREFILLED_SYRINGE | INTRAVENOUS | Status: DC | PRN
  Filled 2016-12-06: qty 5

## 2016-12-06 MED ORDER — ACETAMINOPHEN 325 MG PO TABS: 650.0000 mg | ORAL_TABLET | ORAL | Status: DC | PRN

## 2016-12-06 MED ORDER — WITCH HAZEL-GLYCERIN EX PADS: 1.0000 "application " | MEDICATED_PAD | CUTANEOUS | Status: DC | PRN

## 2016-12-06 MED ORDER — LIDOCAINE HCL (PF) 1 % IJ SOLN
30.0000 mL | INTRAMUSCULAR | Status: DC | PRN
  Filled 2016-12-06: qty 30

## 2016-12-06 MED ORDER — OXYTOCIN 40 UNITS IN LACTATED RINGERS INFUSION - SIMPLE MED
1.0000 m[IU]/min | INTRAVENOUS | Status: DC
  Administered 2016-12-06: 2 m[IU]/min via INTRAVENOUS

## 2016-12-06 MED ORDER — DIBUCAINE 1 % RE OINT: 1.0000 "application " | TOPICAL_OINTMENT | RECTAL | Status: DC | PRN

## 2016-12-06 MED ORDER — ONDANSETRON HCL 4 MG/2ML IJ SOLN: 4.0000 mg | INTRAMUSCULAR | Status: DC | PRN

## 2016-12-06 MED ORDER — SOD CITRATE-CITRIC ACID 500-334 MG/5ML PO SOLN: 30.0000 mL | ORAL | Status: DC | PRN

## 2016-12-06 MED ORDER — FENTANYL CITRATE (PF) 100 MCG/2ML IJ SOLN: 100.0000 ug | INTRAMUSCULAR | Status: DC | PRN

## 2016-12-06 MED ORDER — LIDOCAINE HCL (PF) 1 % IJ SOLN
INTRAMUSCULAR | Status: DC | PRN
  Administered 2016-12-06 (×2): 4 mL via EPIDURAL

## 2016-12-06 MED ORDER — TETANUS-DIPHTH-ACELL PERTUSSIS 5-2.5-18.5 LF-MCG/0.5 IM SUSP
0.5000 mL | Freq: Once | INTRAMUSCULAR | Status: AC
  Administered 2016-12-07: 0.5 mL via INTRAMUSCULAR
  Filled 2016-12-06: qty 0.5

## 2016-12-06 MED ORDER — OXYTOCIN BOLUS FROM INFUSION: 500.0000 mL | Freq: Once | INTRAVENOUS | Status: DC

## 2016-12-06 MED ORDER — OXYCODONE-ACETAMINOPHEN 5-325 MG PO TABS: 1.0000 | ORAL_TABLET | ORAL | Status: DC | PRN

## 2016-12-06 NOTE — H&P (Signed)
LABOR AND DELIVERY ADMISSION HISTORY AND PHYSICAL NOTE  Joann Marshall is a 26 y.o. female G3P1011 with IUP at 4418w0d by u/s at 5 weeks presenting for IOL in the settings of cholestasis. Patient was diagnosed at 14 weeks and was initially started on ursodiol 300 mg bid with minimal change and was increased to 500 mg tid. Prenatal has been otherwise without any complications. Patient is Rh neg and will receive rhogam postpartum.  She reports positive fetal movement. She denies leakage of fluid or vaginal bleeding.  Prenatal History/Complications:  Past Medical History: Past Medical History:  Diagnosis Date  . 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 Headache   . Hx of varicella   . Partial septate uterus   . Spontaneous abortion in first trimester 04/20/2014   04/20/2014    . UTI (urinary tract infection)     Past Surgical History: Past Surgical History:  Procedure Laterality Date  . NO PAST SURGERIES      Obstetrical History: OB History    Gravida Para Term Preterm AB Living   3 1 1   1 1    SAB TAB Ectopic Multiple Live Births   1     0 1      Social History: Social History   Social History  . Marital status: Single    Spouse name: N/A  . Number of children: N/A  . Years of education: N/A   Social History Main Topics  . Smoking status: Never Smoker  . Smokeless tobacco: Never Used  . Alcohol use No  . Drug use: No     Comment: not since she found out she was pregnant  . Sexual activity: Not Currently    Birth control/ protection: None   Other Topics Concern  . None   Social History Narrative   ** Merged History Encounter **        Family History: Family History  Problem Relation Age of Onset  . Heart disease Paternal Grandfather   . Other Maternal Grandfather        Heart surgery  . Alcohol abuse Maternal Grandfather     Allergies: Allergies  Allergen Reactions  . Amoxicillin     Has patient had a PCN reaction causing immediate rash,  facial/tongue/throat swelling, SOB or lightheadedness with hypotension:unknown Has patient had a PCN reaction causing severe rash involving mucus membranes or skin necrosis: unknown Has patient had a PCN reaction that required hospitalization unknown Has patient had a PCN reaction occurring within the last 10 years:unknown If all of the above answers are "NO", then may proceed with Cephalosporin use.     Prescriptions Prior to Admission  Medication Sig Dispense Refill Last Dose  . calcium carbonate (TUMS - DOSED IN MG ELEMENTAL CALCIUM) 500 MG chewable tablet Chew 1 tablet by mouth 4 (four) times daily as needed for indigestion or heartburn.   12/05/2016 at Unknown time  . Prenatal Vit-Fe Fumarate-FA (PRENATAL MULTIVITAMIN) TABS tablet Take 1 tablet by mouth daily at 12 noon. 60 tablet 3 12/05/2016 at Unknown time  . ursodiol (ACTIGALL) 500 MG tablet Take 500 mg by mouth 3 (three) times daily.   0 12/06/2016 at Unknown time     Review of Systems   All systems reviewed and negative except as stated in HPI  Blood pressure 124/80, pulse 86, temperature 98.5 F (36.9 C), temperature source Oral, height 5\' 9"  (1.753 m), weight 204 lb (92.5 kg), last menstrual period 03/02/2016, not currently breastfeeding. General appearance:  alert, cooperative and appears stated age Lungs: Normal respiratory effort, no audible wheezing Heart: regular rate and pulses palpated bilaterally upper and lower extremities Abdomen: soft, non-tender; gravid abdomen appropriate for age Extremities: No calf swelling or tenderness Presentation: cephalic by nurse exam Fetal monitoring: FHR 130, moderate variability, + accel, no decel Uterine activity: Irregular contractions Dilation: 4 Effacement (%): 60 Station: -2 Exam by:: J.Cox, RN   Prenatal labs: ABO, Rh: O/Negative/-- (10/13 0934) Antibody: Negative (03/06 0912) Rubella: Immune  RPR: Non Reactive (03/06 0912)  HBsAg: Negative (10/13 0934)  HIV: Non Reactive  (03/06 0912)  GBS: Negative (04/23 0000)  1 hr Glucola: 77 Genetic screening: Normal Anatomy US: Normal ( bilateral pyelectasis @24wks , resolved by 31 wks)  Prenatal Transfer Tool  Maternal Diabetes: No Genetic Screening: Normal Maternal Ultrasounds/Referrals: Normal Fetal Ultrasounds or other Referrals:  None Maternal Substance Abuse:  No Significant Maternal Medications:  Meds include: Other: Ursodiol Significant Maternal Lab Results: Lab values include: Group B Strep negative, Other: Cholestasis  Results for orders placed or performed during the hospital encounter of 12/06/16 (from the past 24 hour(s))  CBC   Collection Time: 12/06/16  7:47 AM  Result Value Ref Range   WBC 14.4 (H) 4.0 - 10.5 K/uL   RBC 3.97 3.87 - 5.11 MIL/uL   Hemoglobin 10.1 (L) 12.0 - 15.0 g/dL   HCT 16.1 (L) 09.6 - 04.5 %   MCV 76.3 (L) 78.0 - 100.0 fL   MCH 25.4 (L) 26.0 - 34.0 pg   MCHC 33.3 30.0 - 36.0 g/dL   RDW 40.9 81.1 - 91.4 %   Platelets 312 150 - 400 K/uL    Patient Active Problem List   Diagnosis Date Noted  . Cholestasis during pregnancy in third trimester 12/06/2016  . Severe Intrahepatic cholestasis of pregnancy, antepartum 07/05/2016  . Marijuana use 05/14/2016  . Supervision of high risk pregnancy, antepartum ICP 05/11/2016  . Rh negative state in antepartum period 05/11/2016  . Partial septate uterus, antepartum 05/11/2016    Assessment: Joann Marshall is a 26 y.o. G3P1011 at [redacted]w[redacted]d here for IOL 2/2 cholestasis. Patient initially started on pitocin.  #Labor: Pitocin #Pain: IV pain meds, epidural upon maternal request #FWB:  Cat 1 #ID:  GBS negative #MOF: Breast #MOC: Undecided Nexplanon vs OCP #Circ:  No circ  Lovena Neighbours, MD 12/06/2016, 9:20 AM   OB FELLOW HISTORY AND PHYSICAL ATTESTATION  I have seen and examined this patient; I agree with above documentation in the resident's note.    Jen Mow, DO [place] [facility] 12/06/2016

## 2016-12-06 NOTE — Anesthesia Postprocedure Evaluation (Signed)
Anesthesia Post Note  Patient: Joann Marshall  Procedure(s) Performed: * No procedures listed *  Patient location during evaluation: Mother Baby Anesthesia Type: Epidural Level of consciousness: awake and alert and oriented Pain management: pain level controlled Vital Signs Assessment: post-procedure vital signs reviewed and stable Respiratory status: spontaneous breathing and nonlabored ventilation Cardiovascular status: stable Postop Assessment: no headache, no backache, epidural receding, patient able to bend at knees, no signs of nausea or vomiting and adequate PO intake Anesthetic complications: no        Last Vitals:  Vitals:   12/06/16 1550 12/06/16 1651  BP: 126/68 126/77  Pulse: 65 72  Resp: 16 16  Temp: 37.1 C 36.6 C    Last Pain:  Vitals:   12/06/16 1752  TempSrc:   PainSc: 3    Pain Goal:                 Joann Marshall,Joann Marshall

## 2016-12-06 NOTE — Anesthesia Procedure Notes (Signed)
Epidural Patient location during procedure: OB Start time: 12/06/2016 11:43 AM End time: 12/06/2016 10:58 AM  Staffing Anesthesiologist: Heather RobertsSINGER, Wes Lezotte Performed: anesthesiologist   Preanesthetic Checklist Completed: patient identified, site marked, pre-op evaluation, timeout performed, IV checked, risks and benefits discussed and monitors and equipment checked  Epidural Patient position: sitting Prep: DuraPrep Patient monitoring: heart rate, cardiac monitor, continuous pulse ox and blood pressure Approach: midline Location: L2-L3 Injection technique: LOR saline  Needle:  Needle type: Tuohy  Needle gauge: 17 G Needle length: 9 cm Needle insertion depth: 7 cm Catheter size: 20 Guage Catheter at skin depth: 12 cm Test dose: negative and Other  Assessment Events: blood not aspirated, injection not painful, no injection resistance and negative IV test  Additional Notes Informed consent obtained prior to proceeding including risk of failure, 1% risk of PDPH, risk of minor discomfort and bruising.  Discussed rare but serious complications including epidural abscess, permanent nerve injury, epidural hematoma.  Discussed alternatives to epidural analgesia and patient desires to proceed.  Timeout performed pre-procedure verifying patient name, procedure, and platelet count.  Patient tolerated procedure well.

## 2016-12-06 NOTE — Lactation Note (Signed)
This note was copied from a baby's chart. Lactation Consultation Note  Patient Name: Joann Marshall ZOXWR'UToday's Date: 12/06/2016 Reason for consult: Initial assessment;Other (Comment) (early term at 37.0 wks. ) [facility] Mom does not want to put baby to breast. BF 1st child for 4 months but baby had difficulty with gaining enough weight, Mom wants to pump/bottle with this baby so she can feel sure baby getting enough with each feeding. Mom did allow LC to demonstrate hand expression and spoon feed baby 2 ml of colostrum. Advised Mom she will need to pump every 3 hours for 15 minutes to encourage milk production, prevent engorgement and protect milk supply. Encouraged to do hand expression for 5 minutes after each pumping and give baby back any amount of EBM she receives. LPT handout given to and reviewed with Mom. Discussed supplemental guidelines and advised next feeding give baby at least 5 ml increasing to 10-15 ml every 3 hours for the next 24 hours. Increase supplemental amounts each day to satisfy baby. Wake baby as needed for feedings every 3 hours. Lactation brochure left for review, advised of OP services and support group. Advised Mom RN will set up DEBP for her to use after she is settled in her PP room. Mom to call for questions/concerns.  Maternal Data Has patient been taught Hand Expression?: Yes Does the patient have breastfeeding experience prior to this delivery?: Yes  Feeding Feeding Type: Breast Milk  LATCH Score/Interventions                      Lactation Tools Discussed/Used     Consult Status Consult Status: Follow-up Date: 12/07/16 Follow-up type: In-patient    Alfred LevinsGranger, [name] 12/06/2016, 4:00 PM

## 2016-12-06 NOTE — Anesthesia Preprocedure Evaluation (Signed)
Anesthesia Evaluation  Patient identified by MRN, date of birth, ID band Patient awake    Reviewed: Allergy & Precautions, H&P , NPO status , Patient's Chart, lab work & pertinent test results  History of Anesthesia Complications Negative for: history of anesthetic complications  Airway Mallampati: I  TM Distance: >3 FB Neck ROM: full    Dental no notable dental hx. (+) Dental Advisory Given   Pulmonary neg pulmonary ROS,    Pulmonary exam normal breath sounds clear to auscultation       Cardiovascular negative cardio ROS Normal cardiovascular exam     Neuro/Psych  Headaches, negative psych ROS   GI/Hepatic negative GI ROS, Neg liver ROS,   Endo/Other  negative endocrine ROS  Renal/GU      Musculoskeletal   Abdominal Normal abdominal exam  (+)   Peds  Hematology negative hematology ROS (+)   Anesthesia Other Findings   Reproductive/Obstetrics (+) Pregnancy                             Anesthesia Physical  Anesthesia Plan  ASA: II  Anesthesia Plan: Epidural   Post-op Pain Management:    Induction:   Airway Management Planned: Natural Airway  Additional Equipment:   Intra-op Plan:   Post-operative Plan:   Informed Consent: I have reviewed the patients History and Physical, chart, labs and discussed the procedure including the risks, benefits and alternatives for the proposed anesthesia with the patient or authorized representative who has indicated his/her understanding and acceptance.   Dental advisory given  Plan Discussed with: Anesthesiologist  Anesthesia Plan Comments:         Anesthesia Quick Evaluation

## 2016-12-06 NOTE — Anesthesia Pain Management Evaluation Note (Signed)
  CRNA Pain Management Visit Note  Patient: Joann Marshall, 26 y.o., female  "Hello I am a member of the anesthesia team at [facility]. We have an anesthesia team available at all times to provide care throughout the hospital, including epidural management and anesthesia for C-section. I don't know your plan for the delivery whether it a natural birth, water birth, IV sedation, nitrous supplementation, doula or epidural, but we want to meet your pain goals."   1.Was your pain managed to your expectations on prior hospitalizations?   Yes   2.What is your expectation for pain management during this hospitalization?     Epidural  3.How can we help you reach that goal? epidural  Record the patient's initial score and the patient's pain goal.   Pain: 0  Pain Goal: 4 [facility] wants you to be able to say your pain was always managed very well.  [name] 12/06/2016

## 2016-12-07 MED ORDER — IBUPROFEN 600 MG PO TABS: 600.0000 mg | ORAL_TABLET | Freq: Four times a day (QID) | ORAL | 0 refills | Status: AC | PRN

## 2016-12-07 NOTE — Procedures (Deleted)
Procedure: Newborn Female Circumcision using a GOMCO device  Indication: Parental request  EBL: Minimal  Complications: None immediate  Anesthesia: 1% lidocaine local, oral sucrose  Parent desires circumcision for her female infant.  Circumcision procedure details, risks, and benefits discussed, and written informed consent obtained. Risks/benefits include but are not limited to: benefits of circumcision in men include reduction in the rates of urinary tract infection (UTI), penile cancer, some sexually transmitted infections, penile inflammatory and retractile disorders, as well as easier hygiene; risks include bleeding, infection, injury of glans which may lead to penile deformity or urinary tract issues, unsatisfactory cosmetic appearance, and other potential complications related to the procedure.  It was emphasized that this is an elective procedure.    Procedure in detail:  A dorsal penile nerve block was performed with 1% lidocaine without epinephrine.  The area was then cleaned with betadine and draped in sterile fashion.  Two hemostats were applied at the 3 o'clock and 9 o'clock positions on the foreskin.  While maintaining traction, a third hemostat was used to sweep around the glans the release adhesions between the glans and the inner layer of mucosa avoiding the 6 o'clock position.  The hemostat was then clamped at the 12 o'clock position in the midline, approximately half the distance to the corona.  The hemostat was then removed and scissors were used to cut along the crushed skin to its most distal point. The foreskin was retracted over the glans removing any additional adhesions with the probe as needed. The foreskin was then placed back over the glans and the 1.3 cm GOMCO bell was inserted over the glans. The two hemostats were removed, with one hemostat holding the foreskin and underlying mucosa.  The clamp was then attached, and after verifying that the dorsal slit rested superior to the  interface between the bell and base plate, the nut was tightened and the foreskin crushed between the bell and the base plate. This was held in place for 5 minutes with excision of the foreskin atop the base plate with the scalpel.  The thumbscrew was then loosened, base plate removed, and then the bell removed with gentle traction.  The area was inspected and found to be hemostatic.  A piece of gelfoam was then applied to the cut edge of the foreskin.     Lovena NeighboursAbdoulaye Harlan Vinal, MD 12/07/2016 11:43 AM

## 2016-12-07 NOTE — Discharge Instructions (Signed)

## 2016-12-07 NOTE — Discharge Summary (Signed)
OB Discharge Summary     Patient Name: Joann Marshall DOB: September 20, 1990 MRN: [id number]  Date of admission: 12/06/2016 Delivering MD: Lovena Neighbours   Date of discharge: 12/07/2016  Admitting diagnosis: INDUCTION Intrauterine pregnancy: [redacted]w[redacted]d     Secondary diagnosis:  Active Problems:   Cholestasis during pregnancy in third trimester  Additional problems: Rh neg     Discharge diagnosis: Term Pregnancy Delivered                                                                                                Post partum procedures:none; infant also Rh neg  Augmentation: Pitocin  Complications: None  Hospital course:  Induction of labor With Vaginal Delivery     26 y.o. yo [id number] at [redacted]w[redacted]d was admitted for IOL due to cholestasis on 12/06/2016. Patient rec'd Pit for an induction method, and had an uncomplicated labor course as follows:  Membrane Rupture Time/Date: 11:18 AM ,12/06/2016   Intrapartum Procedures: Episiotomy: None [1]                                         Lacerations:  Labial [10]  Patient had a delivery of a Viable infant. 12/06/2016  Information for the patient's newborn:  Zurii, Hewes [[id number]]  Delivery Method: Vaginal, Spontaneous Delivery (Filed from Delivery Summary)    [name] had an uncomplicated postpartum course.  She is ambulating, tolerating a regular diet, passing flatus, and urinating well. Patient is discharged home in stable condition on 12/07/16.   Physical exam  Vitals:   12/06/16 1550 12/06/16 1651 12/06/16 2051 12/07/16 0548  BP: 126/68 126/77 122/72 123/66  Pulse: 65 72 90 62  Resp: 16 16 18 16   Temp: 98.8 F (37.1 C) 97.9 F (36.6 C) 97.8 F (36.6 C) 97.6 F (36.4 C)  TempSrc: Oral Tympanic Oral Oral  SpO2:      Weight:      Height:       General: alert and cooperative Lochia: appropriate Uterine Fundus: firm Incision: N/A DVT Evaluation: No evidence of DVT seen on physical exam. Labs: Lab Results  Component Value  Date   WBC 14.4 (H) 12/06/2016   HGB 10.1 (L) 12/06/2016   HCT 30.3 (L) 12/06/2016   MCV 76.3 (L) 12/06/2016   PLT 312 12/06/2016   CMP Latest Ref Rng & Units 11/17/2016  Glucose 65 - 99 mg/dL 87  BUN 6 - 20 mg/dL 7  Creatinine 9.62 - 9.52 mg/dL 8.41(L)  Sodium 244 - 010 mmol/L 136  Potassium 3.5 - 5.2 mmol/L 4.5  Chloride 96 - 106 mmol/L 100  CO2 18 - 29 mmol/L 18  Calcium 8.7 - 10.2 mg/dL 9.2  Total Protein 6.0 - 8.5 g/dL 6.6  Total Bilirubin 0.0 - 1.2 mg/dL <2.7  Alkaline Phos 39 - 117 IU/L 215(H)  AST 0 - 40 IU/L 18  ALT 0 - 32 IU/L 30    Discharge instruction: per After Visit Summary and "Baby and Me Booklet".  After visit meds:  Allergies as of 12/07/2016      Reactions   Amoxicillin    Has patient had a PCN reaction causing immediate rash, facial/tongue/throat swelling, SOB or lightheadedness with hypotension:unknown Has patient had a PCN reaction causing severe rash involving mucus membranes or skin necrosis: unknown Has patient had a PCN reaction that required hospitalization unknown Has patient had a PCN reaction occurring within the last 10 years:unknown If all of the above answers are "NO", then may proceed with Cephalosporin use.      Medication List    STOP taking these medications   calcium carbonate 500 MG chewable tablet Commonly known as:  TUMS - dosed in mg elemental calcium   ursodiol 500 MG tablet Commonly known as:  ACTIGALL     TAKE these medications   ibuprofen 600 MG tablet Commonly known as:  ADVIL,MOTRIN Take 1 tablet (600 mg total) by mouth every 6 (six) hours as needed.   prenatal multivitamin Tabs tablet Take 1 tablet by mouth daily at 12 noon.       Diet: routine diet  Activity: Advance as tolerated. Pelvic rest for 6 weeks.   Outpatient follow up:6 weeks Follow up Appt:Future Appointments Date Time Provider Department Center  12/18/2016 2:15 PM Lazaro ArmsEure, Luther H, MD [facility]   Follow up Visit:No Follow-up on  file.  Postpartum contraception: Progesterone only pills and Nexplanon  Newborn Data: Live born female  Birth Weight: 7 lb 12.3 oz (3525 g) APGAR: 7, 9  Baby Feeding: Breast Disposition:home with mother   12/07/2016 Cam HaiSHAW, Dulcy Sida, CNM 4:00 PM

## 2016-12-07 NOTE — Lactation Note (Addendum)
This note was copied from a baby's chart. Lactation Consultation Note  Patient Name: Joann Marshall WUJWJ'XToday's Date: 12/07/2016 Reason for consult: Follow-up assessment;Other (Comment) (pumping and bottle feeding, 3% weight loss )  Per mom has got'en 2 ml so far when she has pumped.  Baby is 5319 hours old and is being gradually increased with volumes.  Discussed and reviewed supply and demand, and the importance of being consistent with 8 x's a day, and one could be a power pump( 10 mins on ., 10 mins off or 20 mins on 10 mins off over 60 mins ) X1 in 24 hours to protect establishing milk supply. Sore nipple and engorgement prevention and tx reviewed.  Mom has a DEBP Medela - LC reviewed set up for personal pump.  S/S of mastitis reviewed.  Mother informed of post-discharge support and given phone number to the lactation department, including services for phone call assistance; out-patient appointments; and breastfeeding support group. List of other breastfeeding resources in the community given in the handout. Encouraged mother to call for problems or concerns related to breastfeeding.   Maternal Data    Feeding Feeding Type: Bottle Fed - Formula  LATCH Score/Interventions                      Lactation Tools Discussed/Used Tools: Pump;Flanges (per mom ) Flange Size: 27 (per  mom started out with #27 Flange and it is comfortable. LC recommended deceasing to #24 and see how it feels, if comfortable continue ) Breast pump type: Double-Electric Breast Pump WIC Program: No Pump Review: Milk Storage (page #36 - Mother/ Baby teaching booklet ) Initiated by:: MAI  Date initiated:: 12/07/16   Consult Status Consult Status: Complete Date: 12/07/16    Matilde SprangMargaret Ann [name] 12/07/2016, 9:20 AM

## 2016-12-10 LAB — TYPE AND SCREEN
ABO/RH(D): O NEG
Antibody Screen: POSITIVE
DAT, IgG: NEGATIVE
UNIT DIVISION: 0
Unit division: 0

## 2016-12-10 LAB — BPAM RBC
BLOOD PRODUCT EXPIRATION DATE: 201806022359
Blood Product Expiration Date: 201806012359
Unit Type and Rh: 9500
Unit Type and Rh: 9500

## 2016-12-18 ENCOUNTER — Ambulatory Visit: Payer: Medicaid Other | Admitting: Obstetrics & Gynecology

## 2016-12-20 ENCOUNTER — Ambulatory Visit: Payer: Medicaid Other | Admitting: Obstetrics & Gynecology

## 2016-12-20 ENCOUNTER — Encounter: Payer: Self-pay | Admitting: Obstetrics & Gynecology

## 2016-12-20 NOTE — Progress Notes (Signed)
Follow up appointment for results  Chief Complaint  Patient presents with  . Follow-up    Blood pressure 100/62, pulse 73, weight 184 lb (83.5 kg), currently breastfeeding.  Pt is doing well  No itching since before she left the hospital Breastfeeding is going well    MEDS ordered this encounter: No orders of the defined types were placed in this encounter.   Orders for this encounter: No orders of the defined types were placed in this encounter.   Impression: S/p NSVD after induction for cholestasis  Plan: Routine pp exam  Follow Up: Return in about 5 weeks (around 01/24/2017) for post partum visit.       Face to face time:  10 minutes  Greater than 50% of the visit time was spent in counseling and coordination of care with the patient.  The summary and outline of the counseling and care coordination is summarized in the note above.   All questions were answered.  Past Medical History:  Diagnosis Date  . 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 Headache   . Hx of varicella   . Partial septate uterus   . Spontaneous abortion in first trimester 04/20/2014   04/20/2014    . UTI (urinary tract infection)     Past Surgical History:  Procedure Laterality Date  . NO PAST SURGERIES      OB History    Gravida Para Term Preterm AB Living   3 2 2   1 2    SAB TAB Ectopic Multiple Live Births   1     0 2      Allergies  Allergen Reactions  . Amoxicillin     Has patient had a PCN reaction causing immediate rash, facial/tongue/throat swelling, SOB or lightheadedness with hypotension:unknown Has patient had a PCN reaction causing severe rash involving mucus membranes or skin necrosis: unknown Has patient had a PCN reaction that required hospitalization unknown Has patient had a PCN reaction occurring within the last 10 years:unknown If all of the above answers are "NO", then may proceed with Cephalosporin use.     Social History   Social History   . Marital status: Single    Spouse name: N/A  . Number of children: N/A  . Years of education: N/A   Social History Main Topics  . Smoking status: Never Smoker  . Smokeless tobacco: Never Used  . Alcohol use No  . Drug use: No     Comment: not since she found out she was pregnant  . Sexual activity: Not Currently    Birth control/ protection: None   Other Topics Concern  . None   Social History Narrative   ** Merged History Encounter **        Family History  Problem Relation Age of Onset  . Heart disease Paternal Grandfather   . Other Maternal Grandfather        Heart surgery  . Alcohol abuse Maternal Grandfather

## 2017-01-24 ENCOUNTER — Encounter: Payer: Self-pay | Admitting: Women's Health

## 2017-01-24 ENCOUNTER — Ambulatory Visit (INDEPENDENT_AMBULATORY_CARE_PROVIDER_SITE_OTHER): Payer: Medicaid Other | Admitting: Women's Health

## 2017-01-24 DIAGNOSIS — Z8759 Personal history of other complications of pregnancy, childbirth and the puerperium: Secondary | ICD-10-CM

## 2017-01-24 DIAGNOSIS — Z8719 Personal history of other diseases of the digestive system: Secondary | ICD-10-CM

## 2017-01-24 NOTE — Progress Notes (Signed)
Subjective:    Joann Marshall is a 26 y.o. [id number] Caucasian female who presents for a postpartum visit. She is 6 weeks postpartum following a spontaneous vaginal delivery at 37.0 gestational weeks after IOL for ICP dx @ 14wks. Itching has now completely resolved. Anesthesia: epidural. I have fully reviewed the prenatal and intrapartum course. Postpartum course has been uncomplicated. Baby's course has been uncomplicated. Baby is feeding by pumping- now formula b/c supply dwindled but is interested in seeing if she can pick it back up- didn't try anything. Bleeding no bleeding. Bowel function is normal. Bladder function is normal. Patient is sexually active. Last sexual activity: few days ago. Contraception method is condoms. Postpartum depression screening: negative. Score 2.  Last pap 07/2014 and was normal.  The following portions of the patient's history were reviewed and updated as appropriate: allergies, current medications, past medical history, past surgical history and problem list.  Review of Systems Pertinent items are noted in HPI.   Vitals:   01/24/17 1440  BP: 118/76  Pulse: 91  Weight: 177 lb (80.3 kg)   No LMP recorded.  Objective:   General:  alert, cooperative and no distress   Breasts:  deferred, no complaints  Lungs: clear to auscultation bilaterally  Heart:  regular rate and rhythm  Abdomen: soft, nontender   Vulva: normal  Vagina: normal vagina  Cervix:  closed  Corpus: Well-involuted  Adnexa:  Non-palpable  Rectal Exam: No hemorrhoids        Assessment:   Postpartum exam 6 wks s/p SVB after IOL for ICP Bottlefeeding- wants to restart pumping Depression screening Contraception counseling   Plan:  Contraception: condoms  Tips given to increase milk supply Follow up in: Jan for pap & physical or earlier if needed  Joann Marshall, Joann Marshall CNM, [facility] 01/24/2017 2:56 PM

## 2017-01-24 NOTE — Patient Instructions (Signed)
Tips To Increase Milk Supply  Lots of water! Enough so that your urine is clear  Plenty of calories, if you're not getting enough calories, your milk supply can decrease  Breastfeed/pump often, every 2-3 hours x 20-3230mins (have a picture of your baby to look at)  Fenugreek 3 pills 3 times a day, this may make your urine smell like maple syrup  Mother's Milk Tea  Lactation cookies, google for the recipe  Real oatmeal

## 2017-05-10 ENCOUNTER — Ambulatory Visit: Payer: Self-pay | Admitting: Adult Health

## 2017-10-28 ENCOUNTER — Inpatient Hospital Stay: Admit: 2017-10-28 | Discharge: 2017-10-29

## 2017-10-28 ENCOUNTER — Ambulatory Visit: Attending: Obstetrics & Gynecology

## 2017-10-28 DIAGNOSIS — Z3200 Encounter for pregnancy test, result unknown: Principal | ICD-10-CM

## 2017-10-28 DIAGNOSIS — O3680X0 Pregnancy with inconclusive fetal viability, not applicable or unspecified: Secondary | ICD-10-CM

## 2017-10-28 DIAGNOSIS — Z124 Encounter for screening for malignant neoplasm of cervix: Secondary | ICD-10-CM

## 2017-10-28 NOTE — Progress Notes
History reviewed. No pertinent family history.    Home Medications:   Prior to Admission medications    Not on File       Allergies:  Amoxicillin    Objective:     Physical Exam  BP 127/82  - Temp 36.8 ?C (98.2 ?F)  - Resp 14  - Ht 1.753 m (5\' 9" )  - Wt 84.4 kg (186 lb)  - LMP 08/06/2017 (Exact Date)  - BMI 27.47 kg/m?   Body mass index is 27.47 kg/m?Marland Kitchen.     General: well developed, well nourished, in no apparent distress  Abdomen: soft, non-tender, without masses or organomegaly    Pelvic Exam:  External genitalia: within normal limits   Urethra: no abnormality  Vagina: normal appearing vagina with normal color and discharge, no lesions    Cervix: no lesions, retroverted and deviated to right- Pap done    Anus: normal sphincter tone, no lesions  TVUS: Empty gestational sac diameter avg 1.58 cm, US repeated by Dr. Ernestina Ponce    Chaperone: n/a    Assessment and Plan:     Sarah Ponce is a 27 y.o. who presents today without complaints    Patient Active Problem List    Diagnosis Date Noted   ? Visit for confirmation of pregnancy test result with physical exam [Z32.00] 10/28/2017   ? Pregnancy of unknown anatomic location [O28.3] 10/28/2017     Note Last Updated: 10/28/2017     10/28/17- empty gestational sac diameter average-  1.58 cm.  US repeated by Dr. Ernestina Ponce.   Cannot r/o ectopic given empty sac.  BHCG x 2 ordered and ABO RH.  Pain and bleeding precautions given.  F/U US in 1 week - ccs           ICD-10-CM ICD-9-CM    1. Visit for confirmation of pregnancy test result with physical exam Z32.00 V72.40 Chlamydia/N. gonorrhea NAA      Culture, Urine      Urinalysis W Microscopy      [facility]RE[facility] FPGS (REFL)HPV DNA HIGH RISK      Chlamydia/N. gonorrhea NAA      Culture, Urine      Urinalysis W Microscopy      [facility] FPGS (REFL)HPV DNA HIGH RISK      HCG QUANT (PREGNANCY)      HCG QUANT (PREGNANCY)      ABO/Rh      HCG QUANT (PREGNANCY)      ABO/Rh   2. Pregnancy of unknown anatomic location O28.3 796.5

## 2017-10-28 NOTE — Progress Notes
Payor: [employer] [place] / Plan: BCBS BLUE CARD/OUT OF STATE / Product Type: PPO /     Shella SpearingCarrie C Schultz, CNM  10/28/2017 6:00 PM

## 2017-10-28 NOTE — Progress Notes
[facility]  Department of Obstetrics & Gynecology  History and Physical Exam  Sarah Ponce   MRN: [id number]   Age: 27 y.o.   DOB: 09/25/1990     Payor: [employer] [employer]BS [place] / Plan: BCBS BLUE CARD/OUT OF STATE / Product Type: PPO /     Date of Encounter: 10/28/17    Subjective:     Chief Complaint: Here for NOB visit    HPI: Sarah Ponce is a 27 y.o. G1P0 who presents todaywith no complaints.  Denies pain or bleeding    Past Medical History: History reviewed. No pertinent past medical history.    Past Surgical History: History reviewed. No pertinent surgical history.    Patient Active Problem List   Diagnosis   ? Visit for confirmation of pregnancy test result with physical exam   ? Pregnancy of unknown anatomic location     No current outpatient prescriptions on file.     No current facility-administered medications for this visit.        OB History:   OB History   Gravida Para Term Preterm AB Living   1             SAB TAB Ectopic Multiple Live Births                  # Outcome Date GA Lbr Len/2nd Weight Sex Delivery Anes PTL Lv   1 Current                   GYN History:   negative h/o abnormal PAP  negative for history of STDs    Menstrual History:   Patient's last menstrual period was 08/06/2017 (exact date).    Sexual History:  Pt is sexually active.     Social History:   Social History     Social History   ? Marital status: Married     Spouse name: N/A   ? Number of children: N/A   ? Years of education: N/A     Occupational History   ? Not on file.     Social History Main Topics   ? Smoking status: Never Smoker   ? Smokeless tobacco: Never Used   ? Alcohol use No   ? Drug use: No   ? Sexual activity: Yes     Partners: Male     Other Topics Concern   ? Not on file     Social History Narrative   ? No narrative on file         Smoking Status   ? Never Smoker   Smokeless Tobacco   ? Never Used     History   Alcohol Use No         Drug Use No       Past family history:

## 2017-10-30 ENCOUNTER — Ambulatory Visit: Attending: Obstetrics & Gynecology

## 2017-10-30 ENCOUNTER — Inpatient Hospital Stay: Admit: 2017-10-30 | Discharge: 2017-10-31

## 2017-10-30 DIAGNOSIS — O3680X0 Pregnancy with inconclusive fetal viability, not applicable or unspecified: Principal | ICD-10-CM

## 2017-10-30 DIAGNOSIS — N926 Irregular menstruation, unspecified: Principal | ICD-10-CM

## 2017-10-30 DIAGNOSIS — Z3490 Encounter for supervision of normal pregnancy, unspecified, unspecified trimester: Secondary | ICD-10-CM

## 2017-10-31 ENCOUNTER — Ambulatory Visit

## 2017-10-31 DIAGNOSIS — Z87440 Personal history of urinary (tract) infections: Principal | ICD-10-CM

## 2017-10-31 DIAGNOSIS — O020 Blighted ovum and nonhydatidiform mole: Principal | ICD-10-CM

## 2017-10-31 DIAGNOSIS — G43909 Migraine, unspecified, not intractable, without status migrainosus: Secondary | ICD-10-CM

## 2017-10-31 MED ORDER — IBUPROFEN 200 MG PO TABS
200 mg | Freq: Three times a day (TID) | ORAL | PRN
Start: 2017-10-31 — End: ?

## 2017-10-31 MED ORDER — GABAPENTIN 300 MG PO CAPS
600 mg | Freq: Once | ORAL | Status: CN
Start: 2017-10-31 — End: ?

## 2017-10-31 MED ORDER — LACTATED RINGERS IV SOLN
INTRAVENOUS | Status: CN
Start: 2017-10-31 — End: ?

## 2017-10-31 MED ORDER — ACETAMINOPHEN 500 MG PO TABS
1000 mg | Freq: Once | ORAL | Status: CN
Start: 2017-10-31 — End: ?

## 2017-10-31 MED ORDER — TAB-A-VITE PO TABS
1 | ORAL_TABLET | Freq: Every day | ORAL
Start: 2017-10-31 — End: ?

## 2017-10-31 MED ORDER — DIPHENHYDRAMINE HCL 25 MG PO CAPS
25 mg | Freq: Every evening | ORAL | PRN
Start: 2017-10-31 — End: ?

## 2017-11-06 ENCOUNTER — Ambulatory Visit: Attending: Obstetrics & Gynecology

## 2017-11-06 DIAGNOSIS — O034 Incomplete spontaneous abortion without complication: Principal | ICD-10-CM

## 2017-11-06 DIAGNOSIS — G43909 Migraine, unspecified, not intractable, without status migrainosus: Secondary | ICD-10-CM

## 2017-11-06 DIAGNOSIS — Z87440 Personal history of urinary (tract) infections: Principal | ICD-10-CM

## 2017-11-06 MED ORDER — HYDROCODONE-ACETAMINOPHEN 5-325 MG PO TABS
1 | ORAL_TABLET | ORAL | 0 refills | Status: CP | PRN
Start: 2017-11-06 — End: ?

## 2017-11-06 MED ORDER — MISOPROSTOL 100 MCG PO TABS
600 ug | Freq: Once | ORAL | 0 refills | Status: CP
Start: 2017-11-06 — End: ?

## 2017-11-07 ENCOUNTER — Encounter: Attending: Obstetrics & Gynecology

## 2017-11-19 ENCOUNTER — Encounter: Attending: Internal Medicine

## 2017-11-21 ENCOUNTER — Encounter: Attending: Obstetrics & Gynecology

## 2017-12-02 ENCOUNTER — Encounter: Attending: Obstetrics & Gynecology

## 2018-03-18 ENCOUNTER — Encounter: Attending: Advanced Practice Midwife

## 2018-04-28 ENCOUNTER — Encounter: Attending: Obstetrics & Gynecology
